# Patient Record
Sex: Male | Born: 1940 | Race: White | Hispanic: No | Marital: Married | Smoking: Former smoker
Health system: Southern US, Community
[De-identification: ages and names within clinical notes are randomized; demographics above are authoritative.]

## PROBLEM LIST (undated history)

## (undated) DIAGNOSIS — K635 Polyp of colon: Secondary | ICD-10-CM

## (undated) DIAGNOSIS — I639 Cerebral infarction, unspecified: Secondary | ICD-10-CM

## (undated) DIAGNOSIS — R972 Elevated prostate specific antigen [PSA]: Secondary | ICD-10-CM

## (undated) DIAGNOSIS — N200 Calculus of kidney: Secondary | ICD-10-CM

## (undated) DIAGNOSIS — I493 Ventricular premature depolarization: Secondary | ICD-10-CM

## (undated) DIAGNOSIS — M109 Gout, unspecified: Secondary | ICD-10-CM

## (undated) DIAGNOSIS — I10 Essential (primary) hypertension: Secondary | ICD-10-CM

## (undated) DIAGNOSIS — E119 Type 2 diabetes mellitus without complications: Secondary | ICD-10-CM

## (undated) DIAGNOSIS — E785 Hyperlipidemia, unspecified: Secondary | ICD-10-CM

## (undated) DIAGNOSIS — G8929 Other chronic pain: Secondary | ICD-10-CM

## (undated) DIAGNOSIS — M7730 Calcaneal spur, unspecified foot: Secondary | ICD-10-CM

## (undated) DIAGNOSIS — G4700 Insomnia, unspecified: Secondary | ICD-10-CM

## (undated) DIAGNOSIS — D649 Anemia, unspecified: Secondary | ICD-10-CM

## (undated) DIAGNOSIS — G629 Polyneuropathy, unspecified: Secondary | ICD-10-CM

## (undated) DIAGNOSIS — H409 Unspecified glaucoma: Secondary | ICD-10-CM

## (undated) HISTORY — DX: Calcaneal spur, unspecified foot: M77.30

## (undated) HISTORY — DX: Ventricular premature depolarization: I49.3

## (undated) HISTORY — PX: TONSILLECTOMY: SUR1361

## (undated) HISTORY — PX: CARDIOVASCULAR STRESS TEST: SHX262

## (undated) HISTORY — DX: Polyp of colon: K63.5

## (undated) HISTORY — DX: Cerebral infarction, unspecified: I63.9

## (undated) HISTORY — DX: Other chronic pain: G89.29

## (undated) HISTORY — DX: Essential (primary) hypertension: I10

## (undated) HISTORY — DX: Type 2 diabetes mellitus without complications: E11.9

## (undated) HISTORY — PX: ESOPHAGOGASTRODUODENOSCOPY: SHX1529

## (undated) HISTORY — DX: Anemia, unspecified: D64.9

## (undated) HISTORY — DX: Insomnia, unspecified: G47.00

## (undated) HISTORY — DX: Polyneuropathy, unspecified: G62.9

## (undated) HISTORY — DX: Elevated prostate specific antigen (PSA): R97.20

## (undated) HISTORY — PX: OTHER SURGICAL HISTORY: SHX169

## (undated) HISTORY — DX: Gout, unspecified: M10.9

## (undated) HISTORY — DX: Calculus of kidney: N20.0

## (undated) HISTORY — DX: Hyperlipidemia, unspecified: E78.5

## (undated) HISTORY — DX: Unspecified glaucoma: H40.9

---

## 1993-09-23 HISTORY — PX: PENECTOMY: SHX741

## 1998-09-23 HISTORY — PX: CYSTECTOMY: SUR359

## 2008-09-23 HISTORY — PX: HERNIA REPAIR: SHX51

## 2015-09-24 HISTORY — PX: COLONOSCOPY: SHX174

## 2019-07-20 ENCOUNTER — Other Ambulatory Visit: Payer: Self-pay

## 2019-07-20 ENCOUNTER — Ambulatory Visit: Payer: BC Managed Care – PPO | Admitting: Gastroenterology

## 2019-07-20 ENCOUNTER — Encounter: Payer: Self-pay | Admitting: Gastroenterology

## 2019-07-20 VITALS — BP 140/80 | HR 80 | Temp 98.4°F | Ht 70.0 in | Wt 192.0 lb

## 2019-07-20 DIAGNOSIS — K644 Residual hemorrhoidal skin tags: Secondary | ICD-10-CM

## 2019-07-20 DIAGNOSIS — K5900 Constipation, unspecified: Secondary | ICD-10-CM

## 2019-07-20 NOTE — Progress Notes (Signed)
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  He uncommonly has constipation, but last week had not moved his bowels for several days, then was straining heavily and had anal pain with swelling and protrusion.  He showed me a photograph of it, and it appears to be significantly swollen external hemorrhoids.  He started taking 3 stool softeners a day, and the swelling gradually subsided.  He was able to have a good bowel movement yesterday and again today.  He had a colonoscopy in 2017 living in [place], believes there were no polyps and was being kept up to date because of his mother's history of colon cancer in her late 57s.  Aaron Oliver will get the prior report to confirm those findings and to see whether a follow-up colonoscopy was recommended.  He also reports hemorrhoidal banding by a surgeon many years ago while living in [place].  That was performed because he was having rectal bleeding.  ROS:  Review of Systems He denies chest pain dyspnea or dysuria  Past Medical History: Past Medical History:  Diagnosis Date  . Chronic back pain   . Colon polyp   . Diabetes ([place])   . Elevated PSA   . Gout   . Heel spur   . Hyperlipemia   . Hypertension   . Insomnia   . Kidney stone   . Neuropathy   . PVC (premature ventricular contraction)      Past Surgical History: No prior surgery  Family History: Family History  Problem Relation Age of Onset  . Colon cancer Mother        in late 33's  . Diabetes Mother   . Hypertension Mother   . Stroke Father     Social History: Social History   Socioeconomic History   . Marital status: Married    Spouse name: irene  . Number of children: 1  . Years of education: Not on file  . Highest education level: Not on file  Occupational History  . Occupation: retired crim atty  Social Needs  . Financial resource strain: Not on file  . Food insecurity    Worry: Not on file    Inability: Not on file  . Transportation needs    Medical: Not on file    Non-medical: Not on file  Tobacco Use  . Smoking status: Former Research scientist (life sciences)  . Smokeless tobacco: Never Used  Substance and Sexual Activity  . Alcohol use: Yes    Comment: occ  . Drug use: Yes    Types: Marijuana    Comment: rare  . Sexual activity: Not on file  Lifestyle  . Physical activity    Days per week: Not on file    Minutes per session: Not on file  . Stress: Not on file  Relationships  . Social Herbalist on phone: Not on file    Gets together: Not on file    Attends religious service: Not on file    Active member of club or organization: Not on file    Attends meetings of clubs or organizations: Not on file    Relationship status: Not on file  Other Topics  Concern  . Not on file  Social History Narrative  . 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  Allergies: No Known Allergies  Outpatient Meds: Current Outpatient Medications  Medication Sig Dispense Refill  . allopurinol (ZYLOPRIM) 100 MG tablet Take 100 mg by mouth daily.    . Alpha-Lipoic Acid 600 MG CAPS Take 1 capsule by mouth once.    Marland Kitchen aspirin EC 81 MG tablet Take 81 mg by mouth daily.    . carvedilol (COREG) 12.5 MG tablet Take 12.5 mg by mouth 2 (two) times daily with a meal.    . Cholecalciferol (VITAMIN D3) 50 MCG (2000 UT) TABS Take by mouth once.    . colchicine 0.6 MG tablet Take 0.6 mg by mouth daily.    . Ferrous Sulfate (IRON) 325 (65 Fe) MG TABS Take by mouth once.    . gabapentin  (NEURONTIN) 600 MG tablet Take 600 mg by mouth 2 (two) times daily.    . metFORMIN (GLUCOPHAGE) 1000 MG tablet Take 1,000 mg by mouth 2 (two) times daily with a meal.    . Multiple Vitamins-Minerals (CENTRUM ADULTS) TABS Take 1 tablet by mouth once.    . rosuvastatin (CRESTOR) 20 MG tablet Take 20 mg by mouth daily.    . valsartan-hydrochlorothiazide (DIOVAN-HCT) 160-12.5 MG tablet Take 1 tablet by mouth daily.    Marland Kitchen zolpidem (AMBIEN) 10 MG tablet Take 10 mg by mouth at bedtime as needed for sleep.     No current facility-administered medications for this visit.       ___________________________________________________________________ Objective   Exam:  BP 140/80   Pulse 80   Temp 98.4 F (36.9 C)   Ht 5\' 10"  (1.778 m)   Wt 192 lb (87.1 kg)   BMI 27.55 kg/m    General: Well-appearing  Eyes: sclera anicteric, no redness  ENT: oral mucosa moist without lesions, no cervical or supraclavicular lymphadenopathy  CV: RRR without murmur, S1/S2, no JVD, no peripheral edema  Resp: clear to auscultation bilaterally, normal RR and effort noted  GI: soft, no tenderness, with active bowel sounds. No guarding or palpable organomegaly noted.  Skin; warm and dry, no rash or jaundice noted  Neuro: awake, alert and oriented x 3. Normal gross motor function and fluent speech Rectal:small,  Nonthrombosed external hemorrhoid tissue, DRE without tenderness, fissure or palpable internal lesion.  Prostate soft and enlarged, soft stool rectal vault.  Labs:  Last hemoglobin normal in May of this year with primary care.  Assessment: Encounter Diagnoses  Name Primary?  . External hemorrhoid Yes  . Acute constipation     Exacerbation of external hemorrhoid swelling during a period of constipation, now resolved.  He may have even had thrombosis of external hemorrhoids that triggered this. We discussed hemorrhoidal anatomy, diagram was shown.  Plan:  Sufficient water and dietary fiber  intake, stool softener every other day to avoid constipation and similar episodes in the future. Not clear if he should have another routine colonoscopy unless previous procedure reports can be obtained for review. See me as needed.  Thank you for the courtesy of this consult.  Please call me with any questions or concerns.  Nelida Meuse III  CC: Referring provider noted above

## 2019-07-20 NOTE — Patient Instructions (Addendum)
If you are age 78 or older, your body mass index should be between 23-30. Your Body mass index is 27.55 kg/m. If this is out of the aforementioned range listed, please consider follow up with your Primary Care Provider.  If you are age 56 or younger, your body mass index should be between 19-25. Your Body mass index is 27.55 kg/m. If this is out of the aformentioned range listed, please consider follow up with your Primary Care Provider.   Start Miralax 1- capful daily- dissolve 17gram in at least 8 ounces of water /juice daily.  Thank you for choosing me and [facility].  Dr.Henry Loletha Carrow

## 2021-02-12 ENCOUNTER — Other Ambulatory Visit: Payer: Self-pay | Admitting: Internal Medicine

## 2021-02-12 ENCOUNTER — Other Ambulatory Visit (HOSPITAL_COMMUNITY): Payer: Self-pay | Admitting: Internal Medicine

## 2021-02-12 DIAGNOSIS — R531 Weakness: Secondary | ICD-10-CM

## 2021-02-12 DIAGNOSIS — R42 Dizziness and giddiness: Secondary | ICD-10-CM

## 2021-02-13 ENCOUNTER — Other Ambulatory Visit: Payer: Self-pay

## 2021-02-13 ENCOUNTER — Other Ambulatory Visit (HOSPITAL_COMMUNITY): Payer: Self-pay | Admitting: Internal Medicine

## 2021-02-13 ENCOUNTER — Ambulatory Visit (HOSPITAL_COMMUNITY)
Admission: RE | Admit: 2021-02-13 | Discharge: 2021-02-13 | Disposition: A | Discharge status: Home / Self Care | Payer: Medicare Other | Source: Ambulatory Visit | Attending: Internal Medicine | Admitting: Internal Medicine

## 2021-02-13 ENCOUNTER — Other Ambulatory Visit: Payer: Self-pay | Admitting: Internal Medicine

## 2021-02-13 DIAGNOSIS — R42 Dizziness and giddiness: Secondary | ICD-10-CM | POA: Insufficient documentation

## 2021-02-13 DIAGNOSIS — I639 Cerebral infarction, unspecified: Secondary | ICD-10-CM

## 2021-02-13 DIAGNOSIS — R531 Weakness: Secondary | ICD-10-CM | POA: Diagnosis present

## 2021-02-13 IMAGING — MR MR MRA HEAD W/O CM
2 series · 19 of 48 positions shown · non-contrast
Comparison: MRI same day

CLINICAL DATA: Weakness, dizziness and giddiness. Small acute
bilateral cerebellar infarctions.

EXAM:
MRA HEAD WITHOUT CONTRAST
TECHNIQUE: Angiographic images of the Circle of Willis were acquired using MRA
technique without intravenous contrast.

[Series 3: ax (id) · axial · 1.0mm · 0.43mm/px · z∈[-85,-3]mm · 18 of 176 slices shown]
[im 1/176]
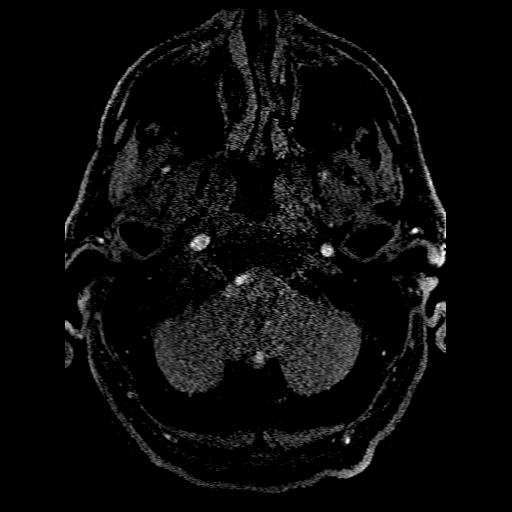
[im 4/176]
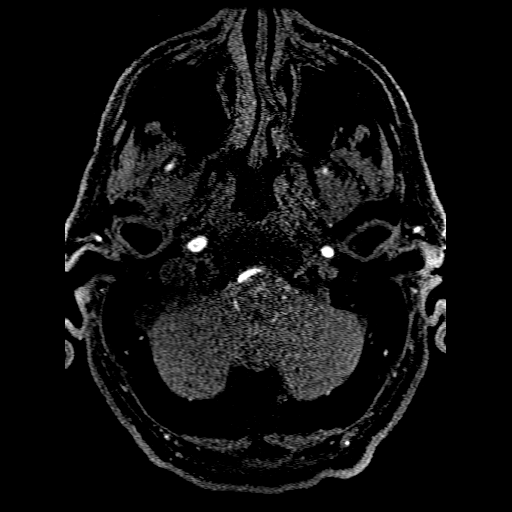
[im 8/176]
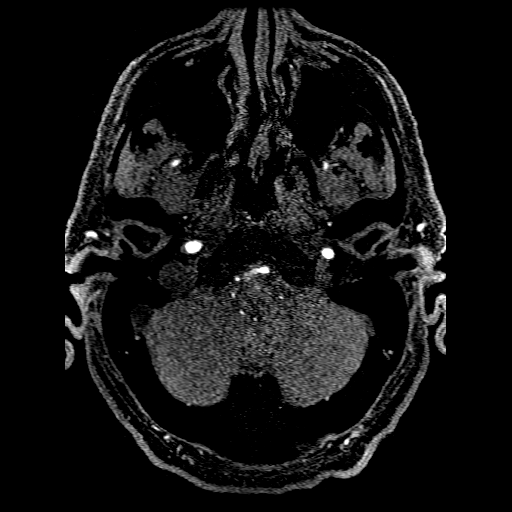
[im 12/176]
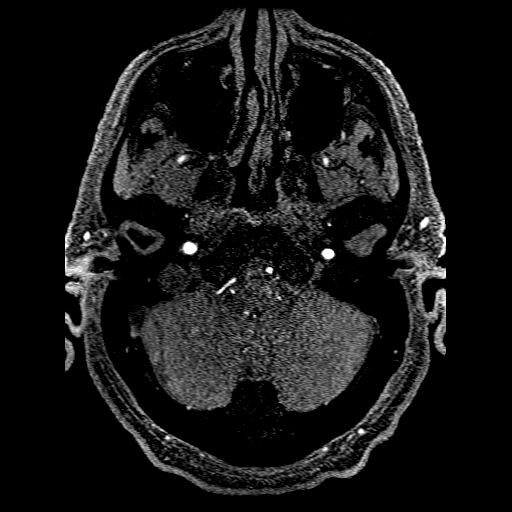
[im 16/176]
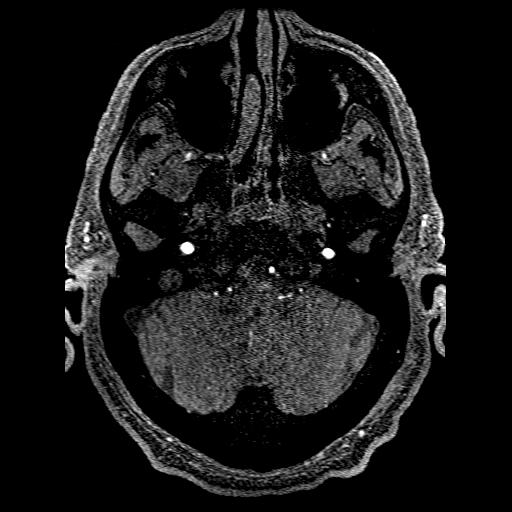
[im 20/176]
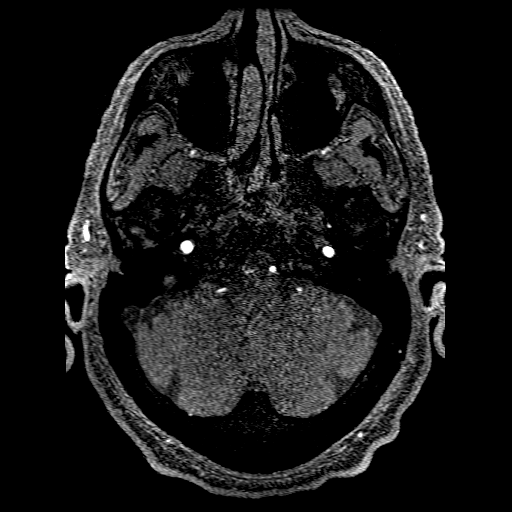
[im 23/176]
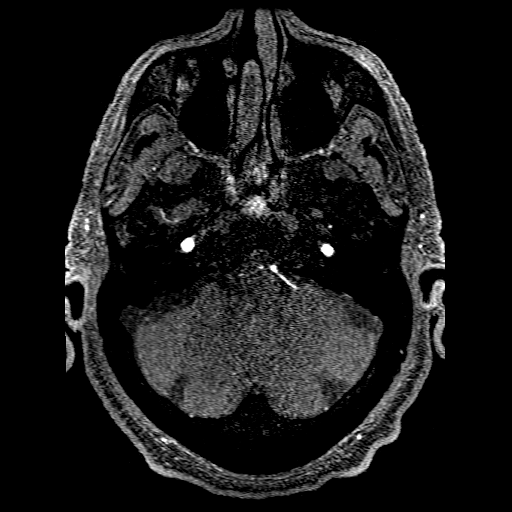
[im 27/176]
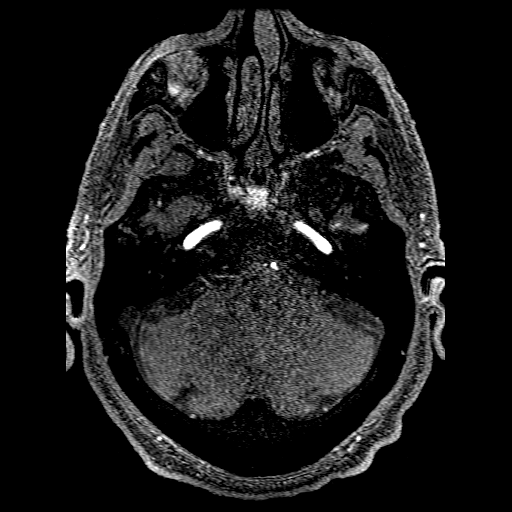
[im 31/176]
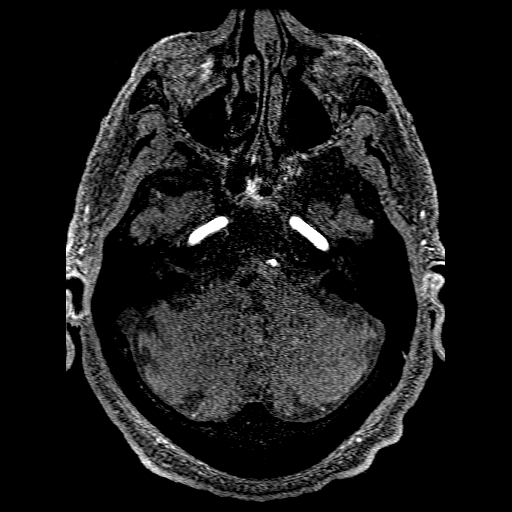
[im 35/176]
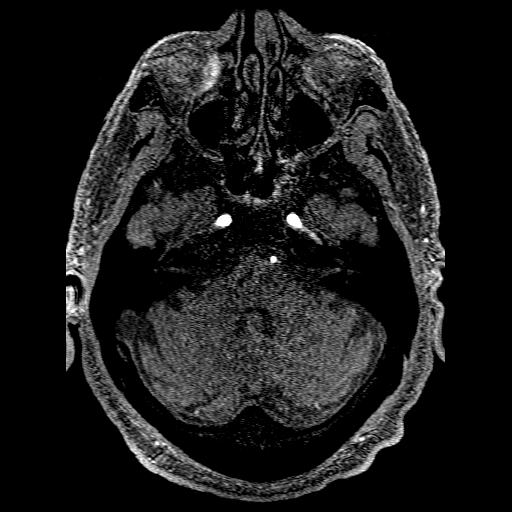
[im 54/176]
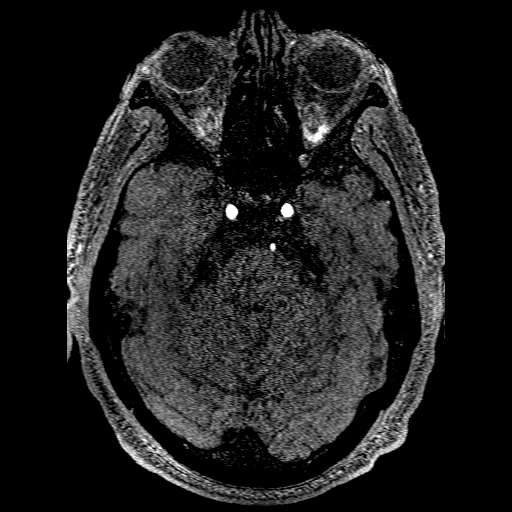
[im 77/176]
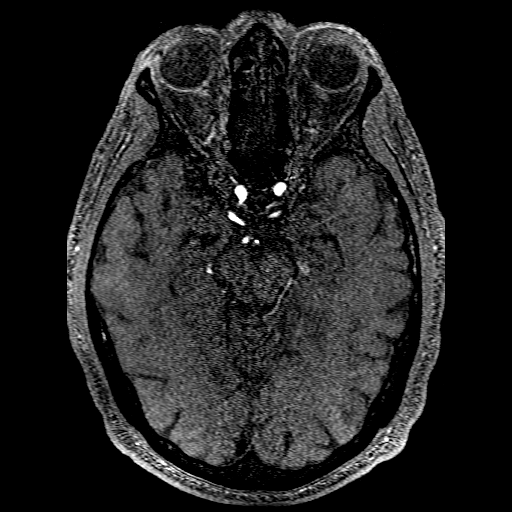
[im 88/176]
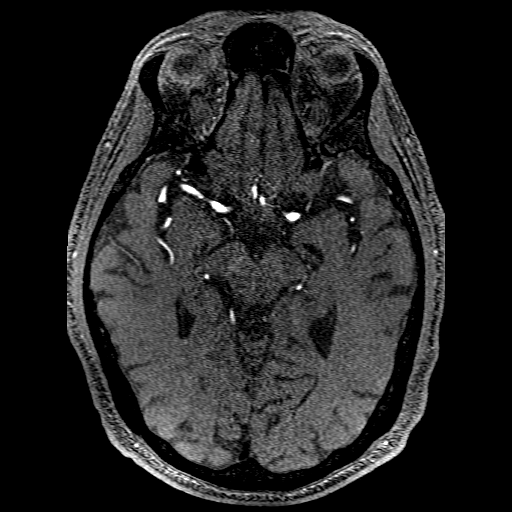
[im 99/176]
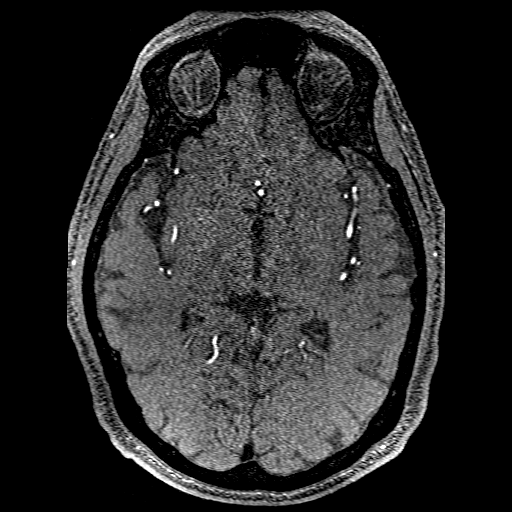
[im 122/176]
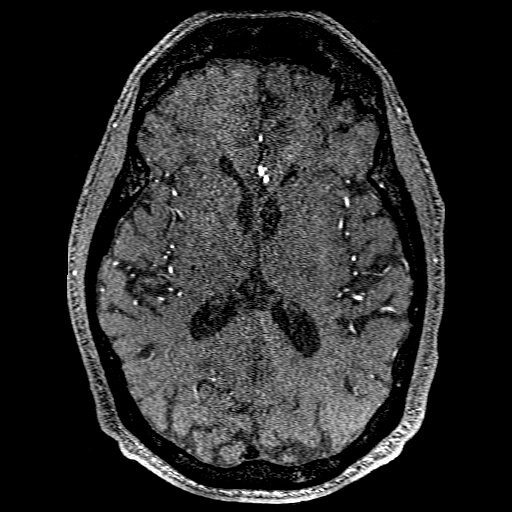
[im 145/176]
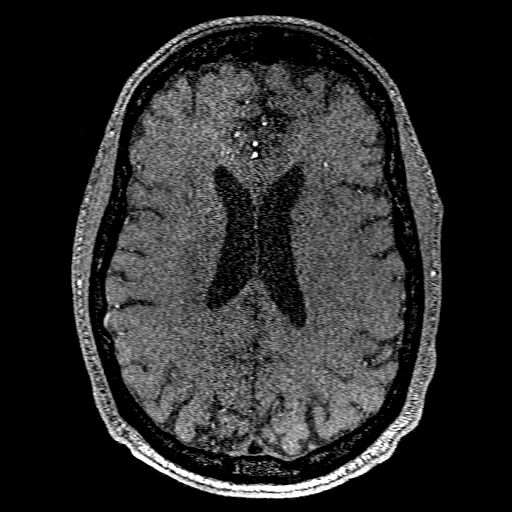
[im 149/176]
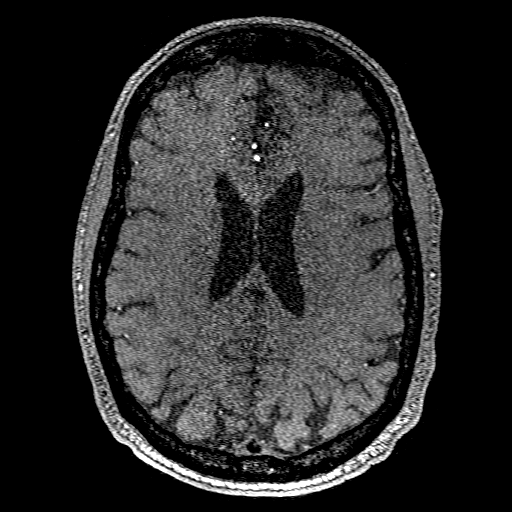
[im 168/176]
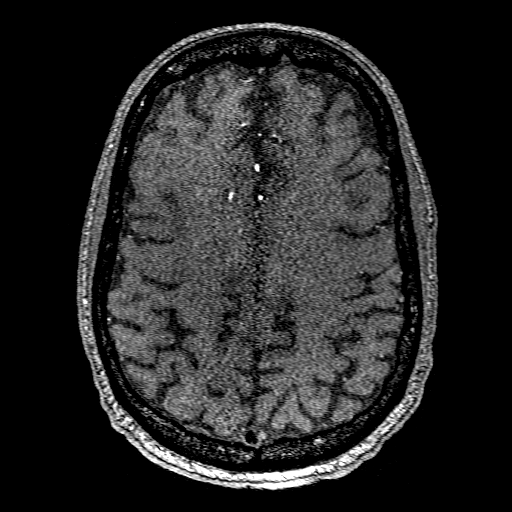

[Series 301: pjn:ax (id) · sagittal · 1.0mm · 0.43mm/px · 1 of 3 slices shown]
[im 1/3]
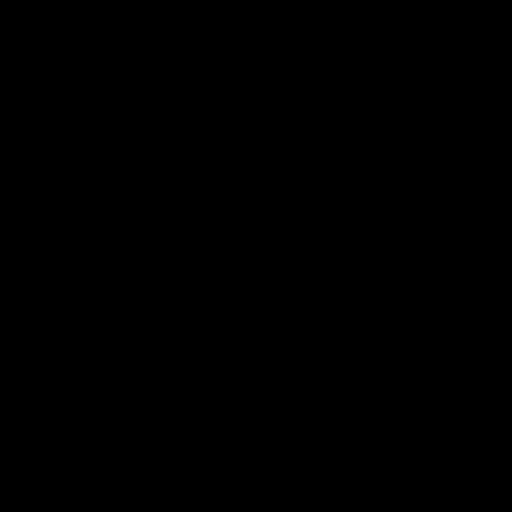

[19 of 48 positions shown; findings below may reference images not displayed]

FINDINGS: Anterior circulation: Both internal carotid arteries widely patent
through the skull base and siphon regions. The anterior and middle
cerebral vessels are widely patent and normal in appearance. Both
posterior cerebral arteries take fetal origin from the anterior
circulation and appear normal. The left PCA does receive some supply
from the basilar tip.

Posterior circulation: Image volume begins above the foramen magnum.
Flow is present in the dominant right vertebral artery. Right PICA
shows flow. No antegrade flow seen in a left vertebral artery. The
basilar artery is small but patent. Both anterior inferior
cerebellar arteries show flow, larger on the left than the right.
Basilar tip shows supply to both superior cerebellar arteries.
Minimal contribution to the left PCA as noted above.

Anatomic variants: None other significant.

Other: None.
IMPRESSION: The imaging volume is high, beginning above the foramen magnum.
Dominant right vertebral artery gives off PICA and then supplies the
basilar. No antegrade flow seen in the left vertebral artery. Flow
is present in both anterior inferior cerebellar arteries and both
superior cerebellar arteries. Both posterior cerebral arteries take
fetal origin from the anterior circulation, though there is some
contribution from the basilar tip to the left PCA.

## 2021-02-13 IMAGING — MR MR HEAD W/O CM
10 series · 48 of 48 positions shown · non-contrast
Comparison: None.

CLINICAL DATA: Weakness, dizziness and giddiness

EXAM:
MRI HEAD WITHOUT CONTRAST
TECHNIQUE: Multiplanar, multiecho pulse sequences of the brain and surrounding
structures were obtained without intravenous contrast.

[Series 5: DWI · axial · 3.0mm · 1.36mm/px · z∈[-85,+67]mm · 9 of 104 slices shown (1 of 2)]
[im 1/104]
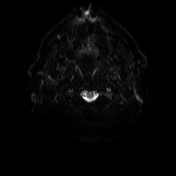
[im 13/104]
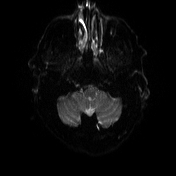
[im 26/104]
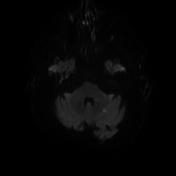
[im 39/104]
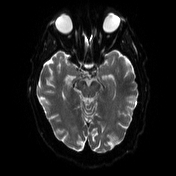
[im 52/104]
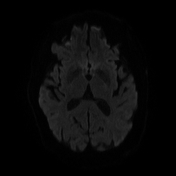
[im 65/104]
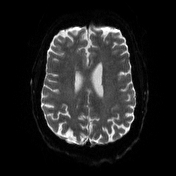
[im 78/104]
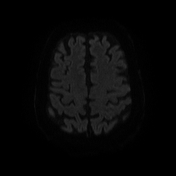
[im 91/104]
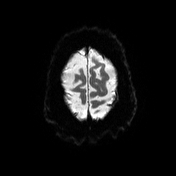
[im 104/104]
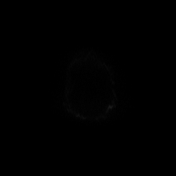

[Series 6: DWI · axial · 3.0mm · 1.36mm/px · z∈[-85,+67]mm · 4 of 52 slices shown (2 of 2)]
[im 1/52]
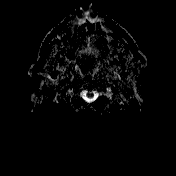
[im 18/52]
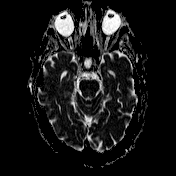
[im 35/52]
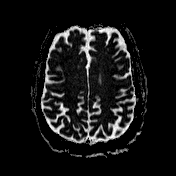
[im 52/52]
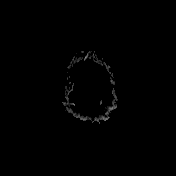

[Series 7: T1 · sagittal · 5.0mm · 0.75mm/px · 2 of 24 slices shown (1 of 2)]
[im 1/24]
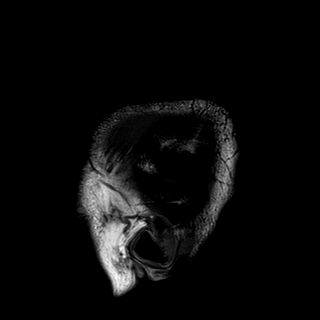
[im 24/24]
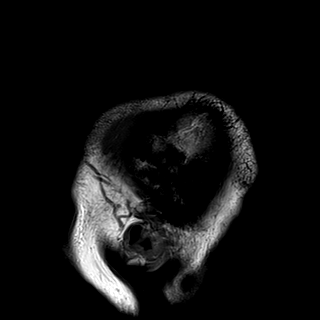

[Series 8: T2 · axial · 5.0mm · 0.62mm/px · z∈[-90,+72]mm · 2 of 26 slices shown (1 of 2)]
[im 1/26]
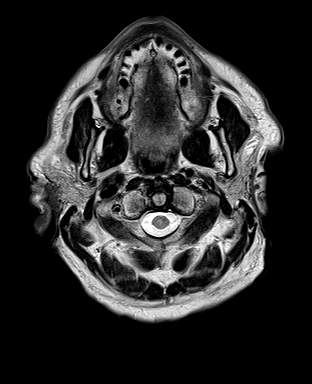
[im 26/26]
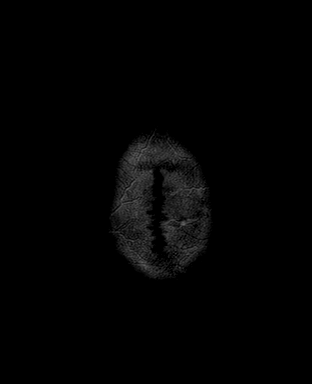

[Series 10: swi_images · axial · 3.0mm · 0.75mm/px · z∈[-115,+97]mm · 6 of 72 slices shown]
[im 1/72]
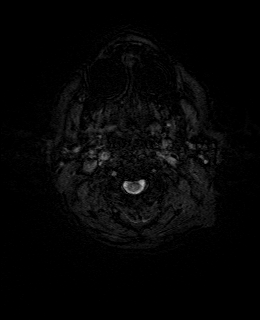
[im 15/72]
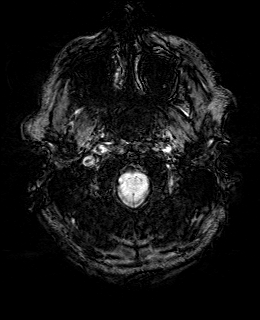
[im 29/72]
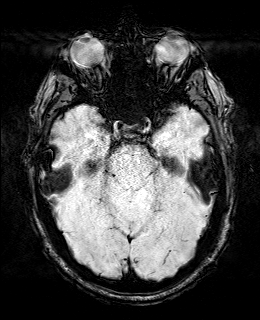
[im 43/72]
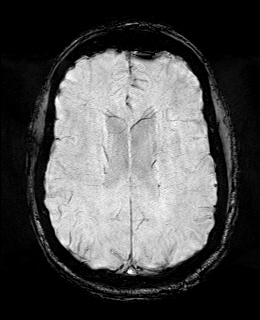
[im 57/72]
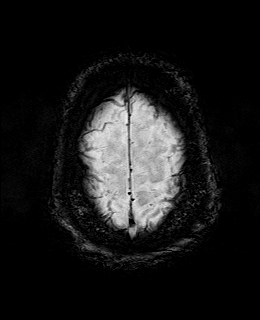
[im 72/72]
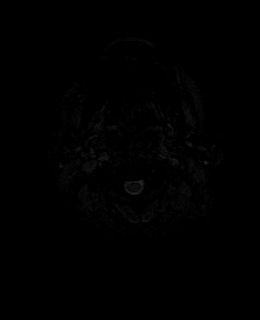

[Series 11: FLAIR · axial · 3.0mm · 0.75mm/px · z∈[-85,+67]mm · 4 of 52 slices shown]
[im 1/52]
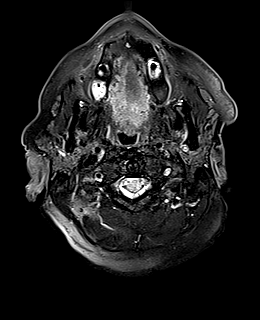
[im 18/52]
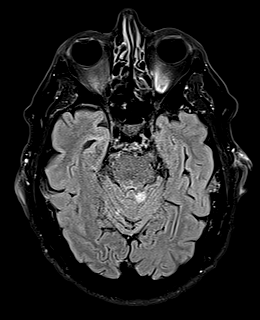
[im 35/52]
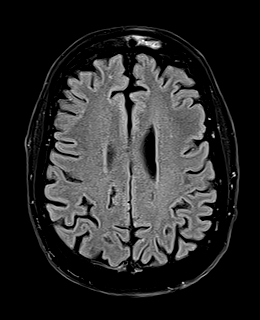
[im 52/52]
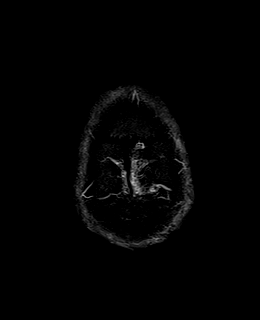

[Series 12: T1 · axial · 1.0mm · 0.94mm/px · z∈[-78,+79]mm · 12 of 160 slices shown (2 of 2)]
[im 1/160]
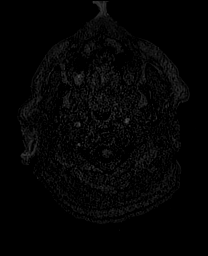
[im 15/160]
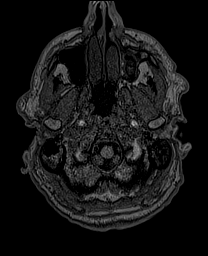
[im 29/160]
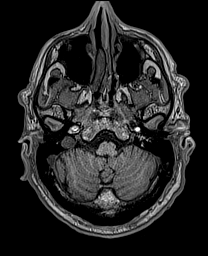
[im 44/160]
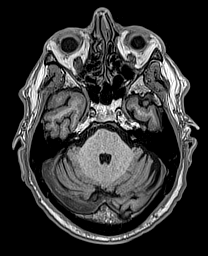
[im 58/160]
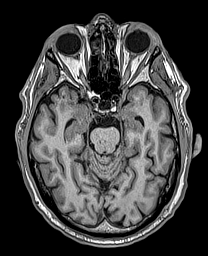
[im 73/160]
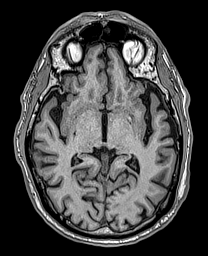
[im 87/160]
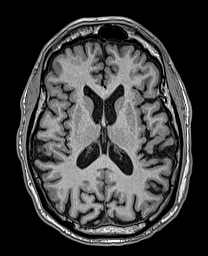
[im 102/160]
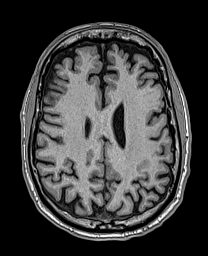
[im 116/160]
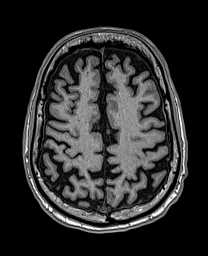
[im 131/160]
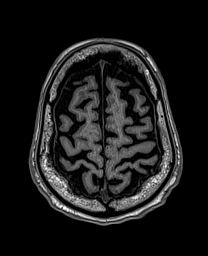
[im 145/160]
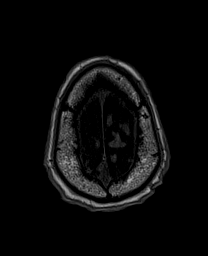
[im 160/160]
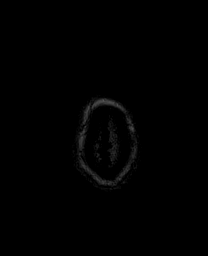

[Series 13: cor dwi_tracew · coronal · 5.0mm · 1.53mm/px · 5 of 64 slices shown]
[im 1/64]
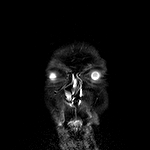
[im 16/64]
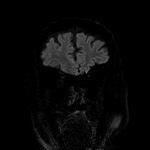
[im 32/64]
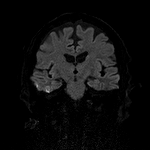
[im 48/64]
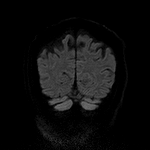
[im 64/64]
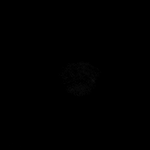

[Series 14: cor dwi_adc · coronal · 5.0mm · 1.53mm/px · 2 of 32 slices shown]
[im 1/32]
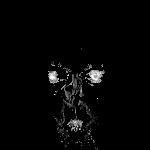
[im 32/32]
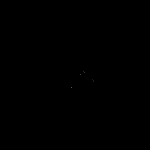

[Series 15: T2 · coronal · 5.0mm · 0.57mm/px · 2 of 32 slices shown (2 of 2)]
[im 1/32]
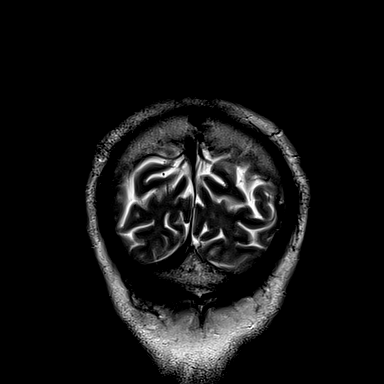
[im 32/32]
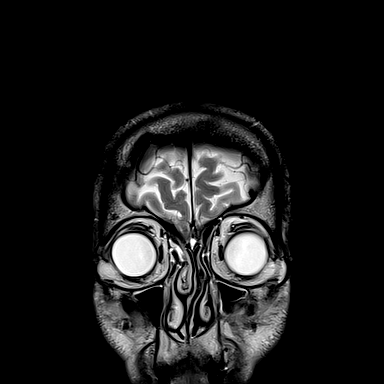

[48 of 48 positions shown; findings below may reference images not displayed]

FINDINGS: Brain: Few small foci of mildly reduced diffusion within the
bilateral cerebellum primarily involving the vermis. There is no
acute infarction or intracranial hemorrhage. There is no
intracranial mass, mass effect, or edema. There is no hydrocephalus
or extra-axial fluid collection. Prominence of the ventricles and
sulci reflects mild generalized parenchymal volume loss. Minimal
punctate foci of T2 hyperintensity in supratentorial white matter
may reflect minor chronic microvascular ischemic changes. Very small
chronic left cerebellar infarct.

Vascular: Diminished left vertebral artery flow void. Major vessel
flow voids at the skull base are otherwise preserved.

Skull and upper cervical spine: Normal marrow signal is preserved.

Sinuses/Orbits: Paranasal sinuses are aerated. Orbits are
unremarkable.

Other: Sella is unremarkable.  Mastoid air cells are clear.
IMPRESSION: Few small acute to subacute bilateral cerebellar infarcts.

Diminished flow void of the left vertebral artery is probably due to
congenitally diminutive size. Vascular imaging can be obtained as
indicated.

Small chronic left cerebellar infarct. Minor chronic microvascular
ischemic changes.

These results will be called to the ordering clinician or
representative by the Radiologist Assistant, and communication
documented in the PACS or [REDACTED].

## 2021-02-13 MED ORDER — GADOBUTROL 1 MMOL/ML IV SOLN
8.5000 mL | Freq: Once | INTRAVENOUS | Status: AC | PRN
  Administered 2021-02-13: 8.5 mL via INTRAVENOUS

## 2021-02-14 ENCOUNTER — Ambulatory Visit: Payer: Medicare Other | Admitting: Interventional Cardiology

## 2021-02-14 ENCOUNTER — Encounter: Payer: Self-pay | Admitting: *Deleted

## 2021-02-14 ENCOUNTER — Encounter: Payer: Self-pay | Admitting: Interventional Cardiology

## 2021-02-14 ENCOUNTER — Ambulatory Visit: Payer: Medicare Other

## 2021-02-14 VITALS — BP 142/68 | HR 69 | Ht 70.0 in | Wt 183.2 lb

## 2021-02-14 DIAGNOSIS — E1159 Type 2 diabetes mellitus with other circulatory complications: Secondary | ICD-10-CM

## 2021-02-14 DIAGNOSIS — I639 Cerebral infarction, unspecified: Secondary | ICD-10-CM

## 2021-02-14 DIAGNOSIS — I10 Essential (primary) hypertension: Secondary | ICD-10-CM

## 2021-02-14 NOTE — Progress Notes (Signed)
Patient ID: Aaron Oliver, male   DOB: 1941-05-26, 80 y.o.   MRN: [id number] ZIO AT not covered by [facility]. Patient enrolled for [employer] to ship a 14 day cardiac event monitor to his home. Letter with instructions mailed to patient.

## 2021-02-14 NOTE — Progress Notes (Signed)
Cardiology Office Note   Date:  02/14/2021   ID:  [name], DOB 1941/07/10, MRN [id number]  PCP:  Velna Hatchet, MD    No chief complaint on file.  CVA noted on MRI  Wt Readings from Last 3 Encounters:  02/14/21 183 lb 3.2 oz (83.1 kg)  07/20/19 192 lb (87.1 kg)       History of Present Illness: Aaron Oliver is a 80 y.o. male who is being seen today for the evaluation of stroke at the request of Velna Hatchet, MD.  Concern for atrial fibrillation.   He is from [place] originally.  Lived in [place] or 40 years.  Moved to [place] in 2016.    A few days ago, he woke up with some balance issues and leg weakness.  He was under stress due to his wife's illness; she has cancer.    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  Diminished flow void of the left vertebral artery is probably due to congenitally diminutive size. Vascular imaging can be obtained as indicated.  Small chronic left cerebellar infarct. Minor chronic microvascular ischemic changes." MRA: "Aortic arch appears normal. Branching pattern of the brachiocephalic vessels is normal without origin stenosis.  Both common carotid arteries are widely patent to the bifurcation. Both carotid bifurcations appear normal. Both cervical internal carotid arteries are normal.  Dominant right vertebral artery shows 30% stenosis at its origin but is widely patent beyond that through the cervical region, through the foramen magnum to the basilar. No flow is seen in the proximal right vertebral artery. There appears to be a reconstituted small right vertebral artery beginning at about the C7 level, which shows definite flow going as high as C1. On some of the source images, there appears to be a small amount of flow beyond the foramen magnum to the basilar. I cannot tell if this is congenital or acquired Pathology."  No prior cardiac history.  Routine stress test in 2016 which  was normal in [place].  Was going to the gym prior to [place].  Now exercises at home 5 days/week.    When he does yard work, he feels a little dizziness. Mild DOE.   Quit smoking about 35 years ago.  Add a DVT.    Mother had an MI was she was 55.  Dad had a stroke at 51. No siblings.   Denies : Chest pain.  Leg edema. Nitroglycerin use. Orthopnea. Palpitations. Paroxysmal nocturnal dyspnea. Syncope.   Mild DOE as noted above.    Past Medical History:  Diagnosis Date  . Chronic back pain   . Colon polyp   . Diabetes ([place])   . Elevated PSA   . Gout   . Heel spur   . Hyperlipemia   . Hypertension   . Insomnia   . Kidney stone   . Neuropathy   . PVC (premature ventricular contraction)     Past Surgical History:  Procedure Laterality Date  . CARDIOVASCULAR STRESS TEST    . COLONOSCOPY  09/2015  . 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 ESOPHAGOGASTRODUODENOSCOPY    . HERNIA REPAIR  5427   umbilicial  . holter mon    . PENECTOMY  1995  . TONSILLECTOMY       Current Outpatient Medications  Medication Sig Dispense Refill  . allopurinol (ZYLOPRIM) 100 MG tablet Take 100 mg by mouth daily.    . Alpha-Lipoic Acid  600 MG CAPS Take 1 capsule by mouth once.    Marland Kitchen aspirin EC 81 MG tablet Take 81 mg by mouth daily.    . carvedilol (COREG) 12.5 MG tablet Take 12.5 mg by mouth 2 (two) times daily with a meal.    . Cholecalciferol (VITAMIN D3) 50 MCG (2000 UT) TABS Take by mouth once.    . colchicine 0.6 MG tablet Take 0.6 mg by mouth daily.    . Ferrous Sulfate (IRON) 325 (65 Fe) MG TABS Take by mouth once.    . metFORMIN (GLUCOPHAGE) 1000 MG tablet Take 1,000 mg by mouth 2 (two) times daily with a meal.    . Multiple Vitamins-Minerals (CENTRUM ADULTS) TABS Take 1 tablet by mouth once.    . naproxen sodium (ALEVE) 220 MG tablet as needed.    . rosuvastatin (CRESTOR) 20 MG tablet Take 20 mg by mouth daily.    . valsartan-hydrochlorothiazide (DIOVAN-HCT) 160-12.5 MG  tablet Take 1 tablet by mouth daily.    Marland Kitchen zolpidem (AMBIEN) 10 MG tablet Take 10 mg by mouth at bedtime as needed for sleep.    . clopidogrel (PLAVIX) 75 MG tablet Take 1 tablet by mouth daily.    Marland Kitchen gabapentin (NEURONTIN) 100 MG capsule Take 100 mg by mouth 2 (two) times daily.    Marland Kitchen gabapentin (NEURONTIN) 400 MG capsule Take 400 mg by mouth 2 (two) times daily.    Marland Kitchen LORazepam (ATIVAN) 1 MG tablet Take 1 mg by mouth at bedtime as needed.    . sildenafil (VIAGRA) 100 MG tablet Take 100 mg by mouth as needed.     No current facility-administered medications for this visit.    Allergies:   Patient has no known allergies.    Social History:  The patient  reports that he has quit smoking. He has never used smokeless tobacco. He reports current alcohol use. He reports current drug use. Drug: Marijuana.   Family History:  The patient's family history includes Colon cancer in his mother; Diabetes in his mother; Hypertension in his mother; Stroke in his father.    ROS:  Please see the history of present illness.   Otherwise, review of systems are positive for balance issues.   All other systems are reviewed and negative.    PHYSICAL EXAM: VS:  BP (!) 142/68   Pulse 69   Ht 5\' 10"  (1.778 m)   Wt 183 lb 3.2 oz (83.1 kg)   SpO2 98%   BMI 26.29 kg/m  , BMI Body mass index is 26.29 kg/m. GEN: Well nourished, well developed, in no acute distress  HEENT: normal  Neck: no JVD, carotid bruits, or masses Cardiac: RRR; no murmurs, rubs, or gallops,no edema  Respiratory:  clear to auscultation bilaterally, normal work of breathing GI: soft, nontender, nondistended, + BS MS: no deformity or atrophy  Skin: warm and dry, no rash Neuro:  Strength and sensation are intact Psych: euthymic mood, full affect   EKG:   The ekg ordered today demonstrates normal sinus rhythm, no ST segment changes   Recent Labs: No results found for requested labs within last 8760 hours.   Lipid Panel No results  found for: CHOL, TRIG, HDL, CHOLHDL, VLDL, LDLCALC, LDLDIRECT   Other studies Reviewed: Additional studies/ records that were reviewed today with results demonstrating: LDL 29.  A1c 5.7 in August 2021.   ASSESSMENT AND PLAN:  1. CVA: COncern for AFib as potential cause of cerebral infarcts.  Check monitor, 2 weeks Zio patch,  to screen for atrial fibrillation.  We also discussed ILR if monitor was negative.  We will also check echocardiogram to look for any structural heart disease. 2. Diabetes: Well-controlled.  Continue current medication.  He needs to try to follow a healthy diet.  High-fiber, plant-based diet will be helpful. 3. Hypertension: The current medical regimen is effective;  continue present plan and medications.   Current medicines are reviewed at length with the patient today.  The patient concerns regarding his medicines were addressed.  The following changes have been made:  No change  Labs/ tests ordered today include:  No orders of the defined types were placed in this encounter.   Recommend 150 minutes/week of aerobic exercise Low fat, low carb, high fiber diet recommended  Disposition:   FU in 1 year, or sooner if there are issues on the cardiac testing   Signed, Larae Grooms, MD  02/14/2021 10:19 AM    [facility] [address], [place], [place]  [address] Phone: [phone]; Fax: [phone]

## 2021-02-14 NOTE — Patient Instructions (Signed)
Medication Instructions:  Your physician recommends that you continue on your current medications as directed. Please refer to the Current Medication list given to you today.  *If you need a refill on your cardiac medications before your next appointment, please call your pharmacy*   Lab Work: none If you have labs (blood work) drawn today and your tests are completely normal, you will receive your results only by: Marland Kitchen MyChart Message (if you have MyChart) OR . A paper copy in the mail If you have any lab test that is abnormal or we need to change your treatment, we will call you to review the results.   Testing/Procedures: Your physician has requested that you have an echocardiogram. Echocardiography is a painless test that uses sound waves to create images of your heart. It provides your doctor with information about the size and shape of your heart and how well your heart's chambers and valves are working. This procedure takes approximately one hour. There are no restrictions for this procedure.  Your physician has recommended that you wear an event monitor. Event monitors are medical devices that record the heart's electrical activity. Doctors most often [place] these monitors to diagnose arrhythmias. Arrhythmias are problems with the speed or rhythm of the heartbeat. The monitor is a small, portable device. You can wear one while you do your normal daily activities. This is usually used to diagnose what is causing palpitations/syncope (passing out).  14 day zio   Follow-Up: At [facility], you and your health needs are our priority.  As part of our continuing mission to provide you with exceptional heart care, we have created designated Provider Care Teams.  These Care Teams include your primary Cardiologist (physician) and Advanced Practice Providers (APPs -  Physician Assistants and Nurse Practitioners) who all work together to provide you with the care you need, when you need it.  We  recommend signing up for the patient portal called "MyChart".  Sign up information is provided on this After Visit Summary.  MyChart is used to connect with patients for Virtual Visits (Telemedicine).  Patients are able to view lab/test results, encounter notes, upcoming appointments, etc.  Non-urgent messages can be sent to your provider as well.   To learn more about what you can do with MyChart, go to [email or web address].    Your next appointment:   12 month(s)  The format for your next appointment:   In Person  Provider:   You may see Larae Grooms, MD or one of the following Advanced Practice Providers on your designated Care Team:    Melina Copa, PA-C  Ermalinda Barrios, PA-C    Other Instructions  ZIO AT Long term monitor-Live Telemetry  Your physician has requested you wear a ZIO patch monitor for 14__ days.  This is a single patch monitor. [employer] supplies one patch monitor per enrollment. Additional stickers are not available.  Please do not apply patch if you will be having a Nuclear Stress Test, Echocardiogram, Cardiac CT, MRI, or Chest Xray during the time frame you would be wearing the monitor. The patch cannot be worn during these tests. You cannot remove and re-apply the ZIO AT patch monitor.   Your ZIO patch monitor will be sent Fed Ex from [employer] directly to your home address. The monitor may also be mailed to a PO BOX if home delivery is not available. It may take 3-5 days to receive your monitor after you have been enrolled.  Once you have received you monitor,  please review enclosed instructions. Your monitor has already been registered assigning a specific monitor serial # to you.   Applying the monitor  Shave hair from upper left chest.  Hold abrader disc by orange tab. Rub abrader in 40 strokes over left upper chest as indicated in your monitor instructions.  Clean area with 4 enclosed alcohol pads. Use all pads to ensure the area is cleaned  thoroughly. Let dry.  Apply patch as indicated in monitor instructions. Patch will be placed under collarbone on left side of chest with arrow pointing upward.  Rub patch adhesive wings for 2 minutes. Remove the white label marked "1". Remove the white label marked "2". Rub patch adhesive wings for 2 additional minutes.  While looking in a mirror, press and release button in center of patch. A small green light will flash 3-4 times. This will be your only indicator the monitor has been turned on.  Do not shower for the first 24 hours. You may shower after the first 24 hours.  Press the button if you feel a symptom. You will hear a small click. Record Date, Time and Symptom in the Patient Log.   Starting the Gateway  In your kit there is a Hydrographic surveyor box the size of a cellphone. This is Airline pilot. It transmits all your recorded data to [facility]. This box must stay within 10 feet of you at all times. Open the box and push the * button. There will be a light that blinks orange and then green a few times. When the light stops blinking, the Gateway is connected to the ZIO patch.  Call [employer] at [phone] to confirm your monitor is transmitting.   Returning your monitor  Remove your patch and place it inside the [place]. In the lower half of the Gateway there is a white bag with prepaid postage on it. Place Gateway in bag and seal. Mail package back to [place] as soon as possible. Your physician should have your final report approximately 7 days after you have mailed back your monitor.   Call [place] at [phone] if you have questions regarding your ZIO AT patch monitor. Call them immediately if you see an orange light blinking on your monitor.  If your monitor falls off in less than 4 days contact our Monitor department at [phone]. If your monitor becomes loose or falls off after 4 days call [employer] at [phone] for suggestions on securing your monitor.

## 2021-02-14 NOTE — Addendum Note (Signed)
Addended by: Thompson Grayer on: 02/14/2021 12:01 PM   Modules accepted: Orders

## 2021-02-20 ENCOUNTER — Other Ambulatory Visit: Payer: Self-pay

## 2021-02-20 ENCOUNTER — Encounter: Payer: Self-pay | Admitting: Neurology

## 2021-02-20 ENCOUNTER — Ambulatory Visit: Payer: Medicare Other | Admitting: Neurology

## 2021-02-20 VITALS — BP 129/65 | HR 76 | Ht 70.0 in | Wt 185.0 lb

## 2021-02-20 DIAGNOSIS — I639 Cerebral infarction, unspecified: Secondary | ICD-10-CM | POA: Diagnosis not present

## 2021-02-20 DIAGNOSIS — R519 Headache, unspecified: Secondary | ICD-10-CM

## 2021-02-20 NOTE — Progress Notes (Signed)
Chief Complaint  Patient presents with  . New Patient (Initial Visit)    He is here with his wife, Aaron Oliver, to follow up after CVA. He is currently taking Plavix 75mg  QD. Since the event, he at times feels off balance. He is having mild headaches daily.      ASSESSMENT AND PLAN  Aaron Oliver is a 80 y.o. male  Acute stroke  Involving bilateral cerebellum, posterior circulation  Thrombosis versus embolic  Is already under cardiologist care, planning on cardiac monitoring, echocardiogram  He was on aspirin, was switched to 75 mg Plavix  Vascular risk factor of aging, hypertension, hyperlipidemia, diabetes,  Emphasized importance of moderate exercise, increase water intake   DIAGNOSTIC DATA (LABS, IMAGING, TESTING) - I reviewed patient records, labs, notes, testing and imaging myself where available.  MRA of brain and neck on Feb 13, 2021:  No abnormal carotid finding.  30% narrowing at the origin of the dominant right vertebral artery. The vessel is widely patent beyond that through the cervical region to the basilar.  No flow is identifiable in the proximal tiny left vertebral artery. This could be occluded and reconstituted by cervical collaterals in the lower cervical region. The vessel then does show flow to the foramen magnum, and it appears that there is the most minimal flow through this vessel to the basilar. It is difficult to know how much of these findings are congenital versus acquired. It is certainly possible that acquired disease of the left vertebral artery could result in the embolic infarctions of the superior cerebellum.  MRI of brain on Feb 13 2021 Few small acute to subacute bilateral cerebellar infarcts.  Diminished flow void of the left vertebral artery is probably due to congenitally diminutive size. Vascular imaging can be obtained as indicated.  Small chronic left cerebellar infarct. Minor chronic microvascular ischemic changes.  These  results will be called to the ordering clinician or representative by the Radiologist Assistant, and communication documented in the PACS or [employer].   HISTORICAL  Aaron Oliver is a 81 year old male, seen in request by his primary care physician Aaron Oliver, for evaluation of stroke, he is accompanied by his wife at today's visit Feb 20, 2021  I reviewed and summarized the referring note. PMHX. Gout HTN DM HLD  On Feb 10, 2021, he woke up from overnight sleep, noticed mild dizziness, unsteady, he thought it was due to his back issues, but he continue to notice intermittent unsteadiness sensation in the late a few days, lasting about 2 to 3 days, when he was seen by his primary care physician Aaron Oliver, referred him for MRI I personally reviewed MRI of the brain on Feb 13, 2021, Few small acute to subacute bilateral cerebellar infarction Angiogram of head and neck showed no significant carotid artery disease, diminished left vertebral artery, patent basilar artery  He was on aspirin, 81 mg daily for many years, since MRI findings, will switch to Plavix 75 mg  PHYSICAL EXAM:   Vitals:   02/20/21 1359  BP: 129/65  Pulse: 76  Weight: 185 lb (83.9 kg)  Height: 5\' 10"  (1.778 m)   Not recorded     Body mass index is 26.54 kg/m.  PHYSICAL EXAMNIATION:  Gen: NAD, conversant, well nourised, well groomed                     Cardiovascular: Regular rate rhythm, no peripheral edema, warm, nontender. Eyes: Conjunctivae clear without exudates or hemorrhage Neck: Supple,  no carotid bruits. Pulmonary: Clear to auscultation bilaterally   NEUROLOGICAL EXAM:  MENTAL STATUS: Speech:    Speech is normal; fluent and spontaneous with normal comprehension.  Cognition:     Orientation to time, place and person     Normal recent and remote memory     Normal Attention span and concentration     Normal Language, naming, repeating,spontaneous speech     Fund of  knowledge   CRANIAL NERVES: CN II: Visual fields are full to confrontation. Pupils are round equal and briskly reactive to light. CN III, IV, VI: extraocular movement are normal. No ptosis. CN V: Facial sensation is intact to light touch CN VII: Face is symmetric with normal eye closure  CN VIII: Hearing is normal to causal conversation. CN IX, X: Phonation is normal. CN XI: Head turning and shoulder shrug are intact  MOTOR: There is no pronator drift of out-stretched arms. Muscle bulk and tone are normal. Muscle strength is normal.  REFLEXES: Reflexes are 2+ and symmetric at the biceps, triceps, knees, and ankles. Plantar responses are flexor.  SENSORY: Intact to light touch, pinprick and vibratory sensation are intact in fingers and toes.  COORDINATION: There is no trunk or limb dysmetria noted.  GAIT/STANCE: Posture is normal. Gait is steady with normal steps, base, arm swing, and turning. Heel and toe walking are normal. Tandem gait is normal.  Romberg is absent.  REVIEW OF SYSTEMS:  Full 14 system review of systems performed and notable only for as above All other review of systems were negative.   ALLERGIES: No Known Allergies  HOME MEDICATIONS: Current Outpatient Medications  Medication Sig Dispense Refill  . allopurinol (ZYLOPRIM) 100 MG tablet Take 100 mg by mouth daily.    . Alpha-Lipoic Acid 600 MG CAPS Take 1 capsule by mouth once.    . carvedilol (COREG) 12.5 MG tablet Take 12.5 mg by mouth 2 (two) times daily with a meal.    . Cholecalciferol (VITAMIN D3) 50 MCG (2000 UT) TABS Take by mouth once.    . clopidogrel (PLAVIX) 75 MG tablet Take 1 tablet by mouth daily.    . colchicine 0.6 MG tablet Take 0.6 mg by mouth daily.    . Ferrous Sulfate (IRON) 325 (65 Fe) MG TABS Take by mouth once.    . gabapentin (NEURONTIN) 100 MG capsule Take 100 mg by mouth 2 (two) times daily.    Marland Kitchen gabapentin (NEURONTIN) 400 MG capsule Take 400 mg by mouth 2 (two) times daily.     Marland Kitchen LORazepam (ATIVAN) 1 MG tablet Take 1 mg by mouth at bedtime as needed.    . metFORMIN (GLUCOPHAGE) 1000 MG tablet Take 1,000 mg by mouth 2 (two) times daily with a meal.    . Multiple Vitamins-Minerals (CENTRUM ADULTS) TABS Take 1 tablet by mouth once.    . naproxen sodium (ALEVE) 220 MG tablet as needed.    . rosuvastatin (CRESTOR) 20 MG tablet Take 20 mg by mouth daily.    . sildenafil (VIAGRA) 100 MG tablet Take 100 mg by mouth as needed.    . valsartan-hydrochlorothiazide (DIOVAN-HCT) 160-12.5 MG tablet Take 1 tablet by mouth daily.    Marland Kitchen zolpidem (AMBIEN) 10 MG tablet Take 10 mg by mouth at bedtime as needed for sleep.     No current facility-administered medications for this visit.    PAST MEDICAL HISTORY: Past Medical History:  Diagnosis Date  . Chronic back pain   . Colon polyp   . Diabetes (  HCC)   . Elevated PSA   . Gout   . Heel spur   . Hyperlipemia   . Hypertension   . Insomnia   . Kidney stone   . Neuropathy   . PVC (premature ventricular contraction)   . Stroke [facility])     PAST SURGICAL HISTORY: Past Surgical History:  Procedure Laterality Date  . CARDIOVASCULAR STRESS TEST    . COLONOSCOPY  09/2015  . 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 ESOPHAGOGASTRODUODENOSCOPY    . HERNIA REPAIR  8756   umbilicial  . holter mon    . PENECTOMY  1995  . TONSILLECTOMY      FAMILY HISTORY: Family History  Problem Relation Age of Onset  . Colon cancer Mother        in late 4's  . Diabetes Mother   . Hypertension Mother   . Stroke Father     SOCIAL HISTORY: Social History   Socioeconomic History  . Marital status: Married    Spouse name: irene  . Number of children: 1  . Years of education: college  . Highest education level: Professional school degree (e.g., MD, DDS, DVM, JD)  Occupational History  . Occupation: retired crim atty  Tobacco Use  . Smoking status: Former Research scientist (life sciences)  . Smokeless tobacco: Never Used  Vaping Use  . Vaping Use:  Never used  Substance and Sexual Activity  . Alcohol use: Yes    Comment: occ  . Drug use: Yes    Types: Marijuana    Comment: rare  . Sexual activity: Not on file  Other Topics Concern  . Not on file  Social History Narrative   Lives at home with his wife.   Right-handed.   Caffeine use: 4 diet sodas per day.   Social Determinants of Health   Financial Resource Strain: Not on file  Food Insecurity: Not on file  Transportation Needs: Not on file  Physical Activity: Not on file  Stress: Not on file  Social Connections: Not on file  Intimate Partner Violence: Not on file     Total time spent reviewing the chart, obtaining history, examined patient, ordering tests, documentation, consultations and family, care coordination was 12   . Marcial Pacas, M.D. Ph.D.  [facility] [address] [place], [place] [address] Ph: [phone] Fax: [phone]  CC:  Velna Hatchet, MD [place],  [place] [address]  Velna Hatchet, MD

## 2021-02-21 ENCOUNTER — Telehealth: Payer: Self-pay | Admitting: Neurology

## 2021-02-21 LAB — HOMOCYSTEINE: Homocysteine: 15.8 umol/L (ref 0.0–19.2)

## 2021-02-21 LAB — CBC WITH DIFFERENTIAL/PLATELET
Basophils Absolute: 0 10*3/uL (ref 0.0–0.2)
Basos: 1 %
EOS (ABSOLUTE): 0.2 10*3/uL (ref 0.0–0.4)
Eos: 3 %
Hematocrit: 35.2 % — ABNORMAL LOW (ref 37.5–51.0)
Hemoglobin: 11.7 g/dL — ABNORMAL LOW (ref 13.0–17.7)
Immature Grans (Abs): 0 10*3/uL (ref 0.0–0.1)
Immature Granulocytes: 0 %
Lymphocytes Absolute: 1.8 10*3/uL (ref 0.7–3.1)
Lymphs: 32 %
MCH: 28.8 pg (ref 26.6–33.0)
MCHC: 33.2 g/dL (ref 31.5–35.7)
MCV: 87 fL (ref 79–97)
Monocytes Absolute: 0.5 10*3/uL (ref 0.1–0.9)
Monocytes: 10 %
Neutrophils Absolute: 3 10*3/uL (ref 1.4–7.0)
Neutrophils: 54 %
Platelets: 150 10*3/uL (ref 150–450)
RBC: 4.06 x10E6/uL — ABNORMAL LOW (ref 4.14–5.80)
RDW: 13.9 % (ref 11.6–15.4)
WBC: 5.6 10*3/uL (ref 3.4–10.8)

## 2021-02-21 LAB — C-REACTIVE PROTEIN: CRP: <1 mg/L (ref 0–10)

## 2021-02-21 LAB — TSH: TSH: 2.17 u[IU]/mL (ref 0.450–4.500)

## 2021-02-21 LAB — VITAMIN D 25 HYDROXY (VIT D DEFICIENCY, FRACTURES): Vit D, 25-Hydroxy: 73.5 ng/mL (ref 30.0–100.0)

## 2021-02-21 LAB — CK: Total CK: 150 U/L (ref 41–331)

## 2021-02-21 LAB — ANA W/REFLEX IF POSITIVE: Anti Nuclear Antibody (ANA): NEGATIVE

## 2021-02-21 LAB — FOLATE: Folate: >20 ng/mL (ref >3.0)

## 2021-02-21 LAB — SEDIMENTATION RATE: Sed Rate: 2 mm/hr (ref 0–30)

## 2021-02-21 LAB — VITAMIN B12: Vitamin B-12: 384 pg/mL (ref 232–1245)

## 2021-02-21 NOTE — Telephone Encounter (Signed)
I spoke to the patient and he verbalized understanding of the findings.

## 2021-02-21 NOTE — Telephone Encounter (Addendum)
Reports mild, intermittent headaches, 1-2 on pain scale. He is under added stress due to his recent stroke and his wife's cancer diagnosis. He said that are just annoying - only taking Tylenol about once per week. He will let us know if they get worse. Dr. Krista Blue is aware.

## 2021-02-21 NOTE — Telephone Encounter (Signed)
Please call patient, laboratory evaluation showed slightly decreased hemoglobin 11.7, which is about his baseline level, rest of the laboratory evaluation was otherwise normal,

## 2021-02-22 ENCOUNTER — Telehealth: Payer: Self-pay | Admitting: Interventional Cardiology

## 2021-02-22 ENCOUNTER — Other Ambulatory Visit (HOSPITAL_COMMUNITY): Payer: Medicare Other

## 2021-02-22 NOTE — Telephone Encounter (Signed)
Spoke with pt and answered all questions about monitor including how long to wear it, where to place it, activities he can and cannot do, etc.  Pt appreciative for call.

## 2021-02-22 NOTE — Telephone Encounter (Signed)
PT is calling in with questions about the heart monitor tha he is being asked to wear.

## 2021-02-26 ENCOUNTER — Ambulatory Visit (HOSPITAL_COMMUNITY)
Admission: RE | Admit: 2021-02-26 | Discharge: 2021-02-26 | Disposition: A | Discharge status: Home / Self Care | Payer: Medicare Other | Source: Ambulatory Visit | Attending: Interventional Cardiology | Admitting: Interventional Cardiology

## 2021-02-26 ENCOUNTER — Other Ambulatory Visit: Payer: Self-pay

## 2021-02-26 DIAGNOSIS — E785 Hyperlipidemia, unspecified: Secondary | ICD-10-CM | POA: Diagnosis not present

## 2021-02-26 DIAGNOSIS — I6389 Other cerebral infarction: Secondary | ICD-10-CM

## 2021-02-26 DIAGNOSIS — I10 Essential (primary) hypertension: Secondary | ICD-10-CM | POA: Insufficient documentation

## 2021-02-26 DIAGNOSIS — E119 Type 2 diabetes mellitus without complications: Secondary | ICD-10-CM | POA: Insufficient documentation

## 2021-02-26 DIAGNOSIS — I639 Cerebral infarction, unspecified: Secondary | ICD-10-CM | POA: Insufficient documentation

## 2021-02-26 DIAGNOSIS — I082 Rheumatic disorders of both aortic and tricuspid valves: Secondary | ICD-10-CM | POA: Diagnosis not present

## 2021-02-26 LAB — ECHOCARDIOGRAM COMPLETE
Area-P 1/2: 3.23 cm2
P 1/2 time: 547 msec
S' Lateral: 3.5 cm

## 2021-02-26 NOTE — Progress Notes (Signed)
  Echocardiogram 2D Echocardiogram has been performed.  Jennette Dubin 02/26/2021, 3:45 PM

## 2021-02-27 ENCOUNTER — Ambulatory Visit (INDEPENDENT_AMBULATORY_CARE_PROVIDER_SITE_OTHER): Payer: Medicare Other

## 2021-02-27 ENCOUNTER — Telehealth: Payer: Self-pay | Admitting: *Deleted

## 2021-02-27 DIAGNOSIS — R42 Dizziness and giddiness: Secondary | ICD-10-CM | POA: Diagnosis not present

## 2021-02-27 DIAGNOSIS — I639 Cerebral infarction, unspecified: Secondary | ICD-10-CM

## 2021-02-27 NOTE — Telephone Encounter (Signed)
Patient had contacted [employer] with his immediate questions.  Reviewed  monitor functions with patient.

## 2021-03-13 ENCOUNTER — Other Ambulatory Visit (HOSPITAL_COMMUNITY): Payer: Medicare Other

## 2021-03-15 ENCOUNTER — Telehealth: Payer: Self-pay | Admitting: *Deleted

## 2021-03-15 NOTE — Telephone Encounter (Signed)
PT is requesting to speak w/ Nurse Fraser Din in regards to an afib monitor he's had since Monday... Pt would like results.. please advise

## 2021-03-15 NOTE — Telephone Encounter (Signed)
Reviewed with monitor department and we have not received any readings of afib or calls from the monitor company.  Final report not available yet.  I spoke with patient who is calling to get results.  I gave him above information and let him know we would call him when final report reviewed by Dr Irish Lack

## 2021-03-19 NOTE — Telephone Encounter (Signed)
He is now having some type of headache daily.Pain is still at 2 on the pain scale. Tylenol provides temporary relief. He is using it most days. I educated him about rebound headaches and instructed him to hold off taking. It should only be used for his more severe pain. He verbalized understanding. He would like to trying this first before asking Dr. Krista Blue for a preventive medication. If they continue to be problematic, he will call us back.

## 2021-03-19 NOTE — Telephone Encounter (Signed)
Pt called, I'm still having headaches. Would like a call from the nurse.

## 2021-03-20 ENCOUNTER — Other Ambulatory Visit: Payer: Self-pay | Admitting: Interventional Cardiology

## 2021-03-20 DIAGNOSIS — I639 Cerebral infarction, unspecified: Secondary | ICD-10-CM

## 2021-03-20 DIAGNOSIS — R42 Dizziness and giddiness: Secondary | ICD-10-CM

## 2021-03-22 MED ORDER — GABAPENTIN 600 MG PO TABS: 600.0000 mg | ORAL_TABLET | Freq: Three times a day (TID) | ORAL | 3 refills | Status: AC

## 2021-03-22 NOTE — Telephone Encounter (Signed)
Per vo by Dr. Krista Blue, provide new prescription for gabapentin 600mg , one cap TID. The patient is agreeable to this plan. He will call back for any further concerns.

## 2021-03-22 NOTE — Telephone Encounter (Signed)
He is currently on gabapentin 400mg , one cap BID and gabapentin 100mg , one cap BID (total of 500mg  BID). He has been on this dosage for many years for neuropathy.

## 2021-03-22 NOTE — Addendum Note (Signed)
Addended by: Desmond Lope on: 03/22/2021 05:33 PM   Modules accepted: Orders

## 2021-03-22 NOTE — Telephone Encounter (Signed)
The patient would like a call from [place]. He would like to discuss the cause of his stroke. Also, review the cardiac testing completed and to see if anything further should be ordered.  He is aware Dr. Krista Blue is out of the office until 03/27/21. Says this is non-urgent and a call next week will be fine.   He will be traveling to [place] on 03/27/21. He would like a call after 3pm or the following day.

## 2021-03-22 NOTE — Telephone Encounter (Signed)
Pt has called back stating he would now like something called in for his headaches, please call.

## 2021-04-02 NOTE — Telephone Encounter (Signed)
I have called patient, onset all related questions, he continues to have intermittent mild unsteady sensation, reviewed MRI of the brain result, only mild small stroke involving bilateral cerebellum, he is under tremendous stress, taking care of his wife, who suffered cancer  Echocardiogram showed no significant abnormality  Advised him to continue moderate exercise, increase water intake

## 2022-04-10 ENCOUNTER — Other Ambulatory Visit (HOSPITAL_COMMUNITY): Payer: Self-pay | Admitting: Internal Medicine

## 2022-04-10 DIAGNOSIS — R0609 Other forms of dyspnea: Secondary | ICD-10-CM

## 2022-04-12 ENCOUNTER — Ambulatory Visit (HOSPITAL_COMMUNITY): Payer: Medicare Other | Attending: Internal Medicine

## 2022-04-12 DIAGNOSIS — R0609 Other forms of dyspnea: Secondary | ICD-10-CM | POA: Insufficient documentation

## 2022-04-12 LAB — ECHOCARDIOGRAM COMPLETE
Area-P 1/2: 3.21 cm2
P 1/2 time: 409 msec
S' Lateral: 3.1 cm

## 2022-04-15 ENCOUNTER — Other Ambulatory Visit (HOSPITAL_BASED_OUTPATIENT_CLINIC_OR_DEPARTMENT_OTHER): Payer: Medicare Other

## 2022-07-01 ENCOUNTER — Telehealth: Payer: Self-pay

## 2022-07-01 ENCOUNTER — Other Ambulatory Visit (INDEPENDENT_AMBULATORY_CARE_PROVIDER_SITE_OTHER): Payer: Medicare Other

## 2022-07-01 ENCOUNTER — Ambulatory Visit: Payer: Medicare Other | Admitting: Nurse Practitioner

## 2022-07-01 ENCOUNTER — Encounter: Payer: Self-pay | Admitting: Nurse Practitioner

## 2022-07-01 DIAGNOSIS — D649 Anemia, unspecified: Secondary | ICD-10-CM

## 2022-07-01 LAB — CBC
HCT: 33.3 % — ABNORMAL LOW (ref 39.0–52.0)
Hemoglobin: 11.3 g/dL — ABNORMAL LOW (ref 13.0–17.0)
MCHC: 34 g/dL (ref 30.0–36.0)
MCV: 87.9 fl (ref 78.0–100.0)
Platelets: 151 10*3/uL (ref 150.0–400.0)
RBC: 3.78 Mil/uL — ABNORMAL LOW (ref 4.22–5.81)
RDW: 14.4 % (ref 11.5–15.5)
WBC: 8.6 10*3/uL (ref 4.0–10.5)

## 2022-07-01 LAB — BASIC METABOLIC PANEL
BUN: 36 mg/dL — ABNORMAL HIGH (ref 6–23)
CO2: 29 mEq/L (ref 19–32)
Calcium: 9.9 mg/dL (ref 8.4–10.5)
Chloride: 99 mEq/L (ref 96–112)
Creatinine, Ser: 1.16 mg/dL (ref 0.40–1.50)
GFR: 59.28 mL/min — ABNORMAL LOW (ref >60.00)
Glucose, Bld: 209 mg/dL — ABNORMAL HIGH (ref 70–99)
Potassium: 3.6 mEq/L (ref 3.5–5.1)
Sodium: 138 mEq/L (ref 135–145)

## 2022-07-01 LAB — IBC + FERRITIN
Ferritin: 104.2 ng/mL (ref 22.0–322.0)
Iron: 112 ug/dL (ref 42–165)
Saturation Ratios: 33.1 % (ref 20.0–50.0)
TIBC: 338.8 ug/dL (ref 250.0–450.0)
Transferrin: 242 mg/dL (ref 212.0–360.0)

## 2022-07-01 MED ORDER — PLENVU 140 G PO SOLR: 1.0000 | ORAL | 0 refills | Status: DC

## 2022-07-01 NOTE — Patient Instructions (Signed)
Your provider has requested that you go to the basement level for lab work before leaving today. Press "B" on the elevator. The lab is located at the first door on the left as you exit the elevator.  Due to recent changes in healthcare laws, you may see the results of your imaging and laboratory studies on MyChart before your provider has had a chance to review them.  We understand that in some cases there may be results that are confusing or concerning to you. Not all laboratory results come back in the same time frame and the provider may be waiting for multiple results in order to interpret others.  Please give [place] 48 hours in order for your provider to thoroughly review all the results before contacting the office for clarification of your results.   You have been scheduled for an endoscopy and colonoscopy. Please follow the written instructions given to you at your visit today. Please pick up your prep supplies at the pharmacy within the next 1-3 days. If you use inhalers (even only as needed), please bring them with you on the day of your procedure.  You will be contacted by our office prior to your procedure for directions on holding your Plavix.  If you do not hear from our office 1 week prior to your scheduled procedure, please call [phone] to discuss.   I appreciate the opportunity to care for you. Carl Best, CRNP

## 2022-07-01 NOTE — Telephone Encounter (Signed)
   Aaron Oliver 10/30/1940 [id number]  Dear Dr Marcial Pacas,  We have scheduled the above named patient for a(n) Colonoscopy/EGD procedure. Our records show that (s)he is on anticoagulation therapy.  Please advise as to whether the patient may come off their therapy of Plavix 5 days prior to their procedure which is scheduled for 07/26/2022.  Please route your response to Olon Russ [place], [place] ([place])  or fax response to [phone].  Sincerely,    [facility]

## 2022-07-01 NOTE — Progress Notes (Signed)
____________________________________________________________  Attending physician addendum:  Thank you for sending this case to me. I have reviewed the entire note and agree with the plan.  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  Nevertheless, given his chronic use of antiplatelet agent, reported weight loss and anemia, endoscopic evaluation is warranted.  Wilfrid Lund, MD  ____________________________________________________________

## 2022-07-01 NOTE — Progress Notes (Signed)
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  He was initially seen by Dr. Loletha Carrow 09/19/2019 due to having a thrombosed hemorrhoid which resolved after he took a stool softener and increase his dietary fiber intake.  He presents today as referred by Dr. Velna Hatchet for further evaluation regarding anemia.  He reported having a history of anemia for the past 20 years.  He started taking oral iron 325 mg once daily 1 or 2 years ago.  He underwent laboratory studies 05/29/2022 which showed a hemoglobin level of 10.3 ( Hg 11.7 on 02/20/2021).  He denies having any dysphagia, heartburn or upper abdominal pain.  He passes a normal formed brown stool 6 days weekly and has diarrhea once weekly which is his typical bowel pattern.  No rectal bleeding or black stools.  His stools floated for about 1 week 4 to 5 months ago which resolved.  He has a history of colon polyps.  His most recent colonoscopy in [place] was in 2017 which he reported was normal, no polyps.  A colonoscopy in 2012 possibly showed a few benign polyps. His mother was diagnosed with colon cancer in her late 64s.  He underwent an EGD a long time ago, no significant findings.  No history of ulcers or GI bleed.  He is on Plavix daily secondary to having a CVA 03/2021.  No ASA or other NSAID use.  He has new onset lower back pain for which he was prescribed a 6-day course of Prednisone which he will finish in 3 days.  His appetite has decreased and he has lost a few pounds which he attributes to worrying about his wife who has cancer.  Weight 185lbs 01/2021. Today's weight 175lbs. He and his wife travel to [place] every 2 weeks for her cancer treatment since 08/2021. He feels warn  down at times but remains active.   Labs 05/29/2022: BUN 29. Cr 1.3. T.bili 0.7. Alk phos 54. AST 23. ALT 24. Hg 10.3. Hct 28.9. MCV 88.4. PLT 122.  Iron levels not done.  Labs 02/20/2021: Hg 11.7. HCT 355.2.  B12 384.  Labs 05/16/2020: Hemoglobin 11.9.  MCV 87.6.  Platelet 128.  ECHO 04/12/2022: IMPRESSIONS Left ventricular ejection fraction, by estimation, is 55 to 60%. The left ventricle has normal function. The left ventricle has no regional wall motion abnormalities. Left ventricular diastolic parameters are consistent with Grade I diastolic dysfunction (impaired relaxation). 1. Right ventricular systolic function is normal. The right ventricular size is normal. There is mildly elevated pulmonary artery systolic pressure. 2. The mitral valve is normal in structure. No evidence of mitral valve regurgitation. No evidence of mitral stenosis. 3. 4. The aortic valve is tricuspid. Aortic valve regurgitation is mild. Aortic dilatation noted. There is mild dilatation of the aortic root, measuring 42 mm. There is mild dilatation of the ascending aorta, measuring 42 mm.  Brain MRI 02/13/2021: Few small acute to subacute bilateral cerebellar infarcts.   Diminished flow void of the left vertebral artery is probably due to congenitally diminutive size. Vascular imaging can be obtained as indicated.   Small chronic left cerebellar infarct. Minor chronic microvascular ischemic changes.  Past Medical History:  Diagnosis Date   Chronic  back pain    Colon polyp    Diabetes ([place])    Elevated PSA    Gout    Heel spur    Hyperlipemia    Hypertension    Insomnia    Kidney stone    Neuropathy    PVC (premature ventricular contraction)    Stroke [facility])    Past Surgical History:  Procedure Laterality Date   CARDIOVASCULAR STRESS TEST     COLONOSCOPY  09/2015   CYSTECTOMY  2000   cyst on nerve on his spine   ESOPHAGOGASTRODUODENOSCOPY     HERNIA REPAIR  3295   umbilicial   holter mon      PENECTOMY  1995   TONSILLECTOMY         Current Outpatient Medications on File Prior to Visit  Medication Sig Dispense Refill   allopurinol (ZYLOPRIM) 100 MG tablet Take 100 mg by mouth daily.     Alpha-Lipoic Acid 600 MG CAPS Take 1 capsule by mouth once.     carvedilol (COREG) 12.5 MG tablet Take 12.5 mg by mouth 2 (two) times daily with a meal.     Cholecalciferol (VITAMIN D3) 50 MCG (2000 UT) TABS Take by mouth once.     clopidogrel (PLAVIX) 75 MG tablet Take 1 tablet by mouth daily.     colchicine 0.6 MG tablet Take 0.6 mg by mouth daily.     Ferrous Sulfate (IRON) 325 (65 Fe) MG TABS Take by mouth once.     gabapentin (NEURONTIN) 600 MG tablet Take 1 tablet (600 mg total) by mouth 3 (three) times daily. 270 tablet 3   LORazepam (ATIVAN) 1 MG tablet Take 1 mg by mouth at bedtime as needed.     metFORMIN (GLUCOPHAGE) 1000 MG tablet Take 1,000 mg by mouth 2 (two) times daily with a meal.     Multiple Vitamins-Minerals (CENTRUM ADULTS) TABS Take 1 tablet by mouth once.     rosuvastatin (CRESTOR) 20 MG tablet Take 20 mg by mouth daily.     sildenafil (VIAGRA) 100 MG tablet Take 100 mg by mouth as needed.     valsartan-hydrochlorothiazide (DIOVAN-HCT) 160-12.5 MG tablet Take 1 tablet by mouth daily.     zolpidem (AMBIEN) 10 MG tablet Take 10 mg by mouth at bedtime as needed for sleep.     naproxen sodium (ALEVE) 220 MG tablet as needed. (Patient not taking: Reported on 07/01/2022)     No current facility-administered medications on file prior to visit.   No Known Allergies  Current Medications, Allergies, Past Medical History, Past Surgical History, Family History and Social History were reviewed in [employer] record.  Review of Systems:   Constitutional: Few pounds of  weight loss.  Negative for fever, sweats or chills. Respiratory: Negative for shortness of breath.   Cardiovascular: Negative for chest pain, palpitations and leg swelling.   Gastrointestinal: See HPI.  Musculoskeletal: Back pain.  Neurological: + Neuropathy.   Physical Exam: BP (!) 140/60 (BP Location: Left Arm, Patient Position: Sitting, Cuff Size: Normal)   Pulse 83   Ht _0  (1.778 m)   Wt 175 lb 6.4 oz (79.6 kg)   SpO2 95%   BMI 25.17 kg/m  Wt Readings from Last 3 Encounters:  07/01/22 175 lb 6.4 oz (79.6 kg)  02/20/21 185 lb (83.9 kg)  02/14/21 183 lb 3.2 oz (83.1 kg)    General: 81 year old male in no acute distress. Head: Normocephalic and atraumatic. Eyes: No scleral icterus. Conjunctiva pink .  Ears: Normal auditory acuity. Mouth: Dentition intact. No ulcers or lesions.  Lungs: Clear throughout to auscultation. Heart: Regular rate and rhythm, no murmur. Abdomen: Soft, nontender and nondistended. No masses or hepatomegaly. Normal bowel sounds x 4 quadrants.  Rectal: Deferred. Musculoskeletal: Symmetrical with no gross deformities. Extremities: No edema. Neurological: Alert oriented x 4. No focal deficits.  Psychological: Alert and cooperative. Normal mood and affect  Assessment and Recommendations:  75) 81 year old male with chronic anemia for "20 years". On Ferrous Sulfate for 1 to 2 years. Hg 10.3 down from his baseline Hg 11.7.  Mildly elevated BUN 29.  No overt GI bleeding, however, potentially at an increased risk of GI bleeding as he is on Plavix. -CBC, BMP and IBC + ferritin  -EGD and colonoscopy benefits and risks discussed including risk with sedation, risk of bleeding, perforation and infection   2) Personal history of colon polyps. Mother diagnosed with colon cancer in her late 48's. Last colonoscopy in [place] was done in 2017, reported as normal per the patient.  -Colonoscopy as ordered above   3) CVA 03/2021 on Plavix  -Our office will contact neurologist Dr. Krista Blue to verify Plavix instructions prior to proceeding with EGD and colonoscopy  4) 10 lbs weigh loss over the past 17 months, decreased appetite.   -Recommend CTAP  if weight loss persists   5) Lower back pain, On short course of Prednisone -I discussed taking Prilosec 20 mg once daily when taking Prednisone  6) Mild thrombocytopenia.  No history of liver disease. -Await CBC result

## 2022-07-02 NOTE — Telephone Encounter (Signed)
Aaron Oliver informed to hold the Plavix 5 days prior to his ECL. He verbalized understanding and I told him to increase his water intake as the Dr.'s note stated. He will try he said.

## 2022-07-02 NOTE — Telephone Encounter (Signed)
To [facility]:  I saw patient on Feb 20, 2021 following his stroke few days prior to that, which is demonstrated by MRI of the brain on Feb 13, 2021, small vessel acute stroke involving bilateral cerebellum.  MRA of brain and neck showed no significant carotid artery stenosis, no flow identifiable in left vertebral artery, 30% narrowing at the dominant right vertebral artery.  Echocardiogram showed no significant normality  Cardiac monitoring in June 2022 showed no significant abnormality.  Patient has been on Plavix 75 mg daily since his stroke.  It is okay to stop Plavix 5 days prior to scheduled colonoscopy  Please advise patient to increase water intake, and restart Plavix as soon as deemed safe.  He is at risk for recurrent thrombotic event,   Aaron Oliver, M.D. Ph.D.  [facility] [place], [place] [address] Phone: [phone] Fax:      [phone]

## 2022-07-23 ENCOUNTER — Encounter: Payer: Medicare Other | Admitting: Gastroenterology

## 2022-07-26 ENCOUNTER — Ambulatory Visit (AMBULATORY_SURGERY_CENTER): Payer: Medicare Other | Admitting: Gastroenterology

## 2022-07-26 ENCOUNTER — Encounter: Payer: Self-pay | Admitting: Gastroenterology

## 2022-07-26 VITALS — BP 143/56 | HR 71 | Temp 98.0°F | Resp 19 | Ht 70.0 in | Wt 175.0 lb

## 2022-07-26 DIAGNOSIS — D123 Benign neoplasm of transverse colon: Secondary | ICD-10-CM | POA: Diagnosis not present

## 2022-07-26 DIAGNOSIS — D649 Anemia, unspecified: Secondary | ICD-10-CM

## 2022-07-26 DIAGNOSIS — K5730 Diverticulosis of large intestine without perforation or abscess without bleeding: Secondary | ICD-10-CM

## 2022-07-26 DIAGNOSIS — D132 Benign neoplasm of duodenum: Secondary | ICD-10-CM

## 2022-07-26 DIAGNOSIS — D124 Benign neoplasm of descending colon: Secondary | ICD-10-CM

## 2022-07-26 DIAGNOSIS — R634 Abnormal weight loss: Secondary | ICD-10-CM

## 2022-07-26 DIAGNOSIS — D122 Benign neoplasm of ascending colon: Secondary | ICD-10-CM

## 2022-07-26 DIAGNOSIS — K6389 Other specified diseases of intestine: Secondary | ICD-10-CM

## 2022-07-26 MED ORDER — SODIUM CHLORIDE 0.9 % IV SOLN: 500.0000 mL | Freq: Once | INTRAVENOUS | Status: DC

## 2022-07-26 NOTE — Progress Notes (Signed)
Called to room to assist during endoscopic procedure.  Patient ID and intended procedure confirmed with present staff. Received instructions for my participation in the procedure from the performing physician.  

## 2022-07-26 NOTE — Op Note (Signed)
[facility] Patient Name: Aaron Oliver Procedure Date: 07/26/2022 2:03 PM MRN: [id number] Endoscopist: [place]. Loletha Carrow , MD, [id number] Age: 81 Referring MD:  Date of Birth: 04/02/1941 Gender: Male Account #: [id number] Procedure:                Colonoscopy Indications:              Unexplained anemia andthrombocytopenia - years),                            Weight loss (lately improved)                           reportedly last colon 2017 at outside clinic in [facility]                           mother with Mosier in her 29's Medicines:                Monitored Anesthesia Care Procedure:                Pre-Anesthesia Assessment:                           - Prior to the procedure, a History and Physical                            was performed, and patient medications and                            allergies were reviewed. The patient's tolerance of                            previous anesthesia was also reviewed. The risks                            and benefits of the procedure and the sedation                            options and risks were discussed with the patient.                            All questions were answered, and informed consent                            was obtained. Prior Anticoagulants: The patient has                            taken Plavix (clopidogrel), last dose was 5 days                            prior to procedure. ASA Grade Assessment: II - A                            patient with mild systemic disease. After reviewing  the risks and benefits, the patient was deemed in                            satisfactory condition to undergo the procedure.                           After obtaining informed consent, the colonoscope                            was passed under direct vision. Throughout the                            procedure, the patient's blood pressure, pulse, and                            oxygen saturations were monitored  continuously. The                            Olympus CF-HQ190L ([id number]) Colonoscope was                            introduced through the anus and advanced to the the                            cecum, identified by appendiceal orifice and                            ileocecal valve. The colonoscopy was performed                            without difficulty. The patient tolerated the                            procedure well. The quality of the bowel                            preparation was good except the sigmoid colon was                            fair. The ileocecal valve, appendiceal orifice, and                            rectum were photographed. Scope In: 2:25:38 PM Scope Out: 2:53:44 PM Scope Withdrawal Time: 0 hours 22 minutes 47 seconds  Total Procedure Duration: 0 hours 28 minutes 6 seconds  Findings:                 The perianal and digital rectal examinations were                            normal.                           Four sessile polyps were found in the ascending  colon. The polyps were 3 to 10 mm in size. These                            polyps were removed with a cold snare. Resection                            and retrieval were complete. (Jar 2)                           Three sessile polyps were found in the proximal                            transverse colon and ascending colon. The polyps                            were 5 to 15 mm in size (largest one piecemeal).                            These polyps were removed with a cold snare.                            Resection and retrieval were complete. (Jar 3)                           Four sessile polyps were found in the transverse                            colon. The polyps were 8 to 10 mm in size. These                            polyps were removed with a cold snare. Resection                            and retrieval were complete. (Jar 4)                           Two  semi-sessile polyps were found in the distal                            transverse colon. The polyps were approx 20 and                            35-40 mm in size. Neither were removed (EMR                            required)                           Four sessile polyps were found in the descending                            colon. The polyps were 8 to 10 mm in size. These  polyps were removed with a cold snare. Resection                            and retrieval were complete. (Jar 5)                           There were several additional sessile and                            peundculated polyps in the left colon randing from                            approx. 8-9m in size. None were removed.(repeat                            scope needed for EMR largest polyp and risk of                            bleeding with multiple polypectomies)                           Diverticula were found in the entire colon.                           The exam was otherwise without abnormality on                            direct and retroflexion views. Complications:            No immediate complications. Estimated Blood Loss:     Estimated blood loss was minimal. Impression:               - Four 3 to 10 mm polyps in the ascending colon,                            removed with a cold snare. Resected and retrieved.                           - Three 5 to 15 mm polyps in the proximal                            transverse colon and in the ascending colon,                            removed with a cold snare. Resected and retrieved.                           - Four 8 to 10 mm polyps in the transverse colon,                            removed with a cold snare. Resected and retrieved.                           - Two 40  mm polyps in the distal transverse colon.                           - Four 8 to 10 mm polyps in the descending colon,                            removed with a cold  snare. Resected and retrieved.                           - Diverticulosis in the entire examined colon.                           - The examination was otherwise normal on direct                            and retroflexion views. Recommendation:           - Patient has a contact number available for                            emergencies. The signs and symptoms of potential                            delayed complications were discussed with the                            patient. Return to normal activities tomorrow.                            Written discharge instructions were provided to the                            patient.                           - Resume previous diet.                           - Resume Plavix (clopidogrel) at prior dose in 3                            days.                           - Await pathology results.                           - Repeat colonoscopy at appointment to be scheduled                            for removal of additonal polyps, including EMR for                            largest TC polyp.                           -  See the other procedure note for documentation of                            additional recommendations. Chelbie Jarnagin L. Loletha Carrow, MD 07/26/2022 3:08:19 PM This report has been signed electronically.

## 2022-07-26 NOTE — Patient Instructions (Addendum)
Handouts on polyps and diverticulosis given to you today  Await pathology results  Resume Plavix in 3 days (on 11/6)  YOU HAD AN ENDOSCOPIC PROCEDURE TODAY AT [facility]:   Refer to the procedure report that was given to you for any specific questions about what was found during the examination.  If the procedure report does not answer your questions, please call your gastroenterologist to clarify.  If you requested that your care partner not be given the details of your procedure findings, then the procedure report has been included in a sealed envelope for you to review at your convenience later.  YOU SHOULD EXPECT: Some feelings of bloating in the abdomen. Passage of more gas than usual.  Walking can help get rid of the air that was put into your GI tract during the procedure and reduce the bloating. If you had a lower endoscopy (such as a colonoscopy or flexible sigmoidoscopy) you may notice spotting of blood in your stool or on the toilet paper. If you underwent a bowel prep for your procedure, you may not have a normal bowel movement for a few days.  Please Note:  You might notice some irritation and congestion in your nose or some drainage.  This is from the oxygen used during your procedure.  There is no need for concern and it should clear up in a day or so.  SYMPTOMS TO REPORT IMMEDIATELY:  Following lower endoscopy (colonoscopy or flexible sigmoidoscopy):  Excessive amounts of blood in the stool  Significant tenderness or worsening of abdominal pains  Swelling of the abdomen that is new, acute  Fever of 100F or higher  Following upper endoscopy (EGD)  Vomiting of blood or coffee ground material  New chest pain or pain under the shoulder blades  Painful or persistently difficult swallowing  New shortness of breath  Fever of 100F or higher  Black, tarry-looking stools  For urgent or emergent issues, a gastroenterologist can be reached at any hour by calling  [phone]. Do not use MyChart messaging for urgent concerns.    DIET:  We do recommend a small meal at first, but then you may proceed to your regular diet.  Drink plenty of fluids but you should avoid alcoholic beverages for 24 hours.  ACTIVITY:  You should plan to take it easy for the rest of today and you should NOT DRIVE or use heavy machinery until tomorrow (because of the sedation medicines used during the test).    FOLLOW UP: Our staff will call the number listed on your records the next business day following your procedure.  We will call around 7:15- 8:00 am to check on you and address any questions or concerns that you may have regarding the information given to you following your procedure. If we do not reach you, we will leave a message.     If any biopsies were taken you will be contacted by phone or by letter within the next 1-3 weeks.  Please call us at [phone] if you have not heard about the biopsies in 3 weeks.   Need repeat colonoscopy in the hospital to take off remaining polyps   SIGNATURES/CONFIDENTIALITY: You and/or your care partner have signed paperwork which will be entered into your electronic medical record.  These signatures attest to the fact that that the information above on your After Visit Summary has been reviewed and is understood.  Full responsibility of the confidentiality of this discharge information lies with you  and/or your care-partner. 

## 2022-07-26 NOTE — Progress Notes (Signed)
No changes to clinical history since GI office visit on 07/01/22.  The patient is appropriate for an endoscopic procedure in the ambulatory setting.  - Wilfrid Lund, MD

## 2022-07-26 NOTE — Op Note (Signed)
[facility] Patient Name: Aaron Oliver Procedure Date: 07/26/2022 2:04 PM MRN: [id number] Endoscopist: [facility] [place]. Loletha Carrow , MD, [id number] Age: 81 Referring MD:  Date of Birth: 07/16/1941 Gender: Male Account #: [id number] Procedure:                Upper GI endoscopy Indications:              Unexplained anemia (and thrombocytopenia - years),                            Weight loss (recently improved) Medicines:                Monitored Anesthesia Care Procedure:                Pre-Anesthesia Assessment:                           - Prior to the procedure, a History and Physical                            was performed, and patient medications and                            allergies were reviewed. The patient's tolerance of                            previous anesthesia was also reviewed. The risks                            and benefits of the procedure and the sedation                            options and risks were discussed with the patient.                            All questions were answered, and informed consent                            was obtained. Prior Anticoagulants: The patient has                            taken Plavix (clopidogrel), last dose was 5 days                            prior to procedure. ASA Grade Assessment: II - A                            patient with mild systemic disease. After reviewing                            the risks and benefits, the patient was deemed in                            satisfactory condition to undergo the procedure.  After obtaining informed consent, the endoscope was                            passed under direct vision. Throughout the                            procedure, the patient's blood pressure, pulse, and                            oxygen saturations were monitored continuously. The                            Endoscope was introduced through the mouth, and                             advanced to the second part of duodenum. The upper                            GI endoscopy was accomplished without difficulty.                            The patient tolerated the procedure well. Scope In: Scope Out: Findings:                 The esophagus was normal.                           The stomach was normal.                           The cardia and gastric fundus were normal on                            retroflexion.                           A single diminutive sessile polyp was found in the                            second portion of the duodenum. The polyp was                            removed with a piecemeal technique using a cold                            biopsy forceps. Resection and retrieval were                            complete.                           The exam of the duodenum was otherwise normal. Complications:            No immediate complications. Estimated Blood Loss:     Estimated blood loss was minimal. Impression:               -  Normal esophagus.                           - Normal stomach.                           - A single duodenal polyp. Resected and retrieved.                           No source of blood loss or weight loss seen.                            Incidental diminnutive duodenal polyp removed. Recommendation:           - Patient has a contact number available for                            emergencies. The signs and symptoms of potential                            delayed complications were discussed with the                            patient. Return to normal activities tomorrow.                            Written discharge instructions were provided to the                            patient.                           - Resume previous diet.                           - Continue present medications.                           - Await pathology results.                           - See the other procedure note for documentation of                             additional recommendations. Aaron Oliver L. Loletha Carrow, MD 07/26/2022 2:24:13 PM This report has been signed electronically.

## 2022-07-26 NOTE — Progress Notes (Signed)
A/ox3, pleased with MAC, report to RN 

## 2022-07-26 NOTE — Progress Notes (Signed)
Pt's states no medical or surgical changes since previsit or office visit. 

## 2022-07-29 ENCOUNTER — Telehealth: Payer: Self-pay | Admitting: *Deleted

## 2022-07-29 NOTE — Telephone Encounter (Signed)
Post procedure follow up call placed, no answer and left VM.  

## 2022-08-01 ENCOUNTER — Encounter: Payer: Self-pay | Admitting: Gastroenterology

## 2022-08-05 ENCOUNTER — Other Ambulatory Visit: Payer: Self-pay

## 2022-08-05 ENCOUNTER — Encounter: Payer: Self-pay | Admitting: Interventional Cardiology

## 2022-08-05 ENCOUNTER — Ambulatory Visit: Payer: Medicare Other | Attending: Interventional Cardiology | Admitting: Interventional Cardiology

## 2022-08-05 VITALS — BP 128/50 | HR 79 | Ht 70.0 in | Wt 180.0 lb

## 2022-08-05 DIAGNOSIS — I10 Essential (primary) hypertension: Secondary | ICD-10-CM | POA: Diagnosis not present

## 2022-08-05 DIAGNOSIS — E1159 Type 2 diabetes mellitus with other circulatory complications: Secondary | ICD-10-CM

## 2022-08-05 DIAGNOSIS — D123 Benign neoplasm of transverse colon: Secondary | ICD-10-CM

## 2022-08-05 DIAGNOSIS — I639 Cerebral infarction, unspecified: Secondary | ICD-10-CM

## 2022-08-05 NOTE — Patient Instructions (Signed)
Medication Instructions:  Your physician recommends that you continue on your current medications as directed. Please refer to the Current Medication list given to you today.  *If you need a refill on your cardiac medications before your next appointment, please call your pharmacy*   Lab Work: none If you have labs (blood work) drawn today and your tests are completely normal, you will receive your results only by: [place] (if you have MyChart) OR A paper copy in the mail If you have any lab test that is abnormal or we need to change your treatment, we will call you to review the results.   Testing/Procedures: Your physician has requested that you have an echocardiogram. Echocardiography is a painless test that uses sound waves to create images of your heart. It provides your doctor with information about the size and shape of your heart and how well your heart's chambers and valves are working. This procedure takes approximately one hour. There are no restrictions for this procedure. Please do NOT wear cologne, perfume, aftershave, or lotions (deodorant is allowed). Please arrive 15 minutes prior to your appointment time. To be done in one year   Follow-Up: At [facility], you and your health needs are our priority.  As part of our continuing mission to provide you with exceptional heart care, we have created designated Provider Care Teams.  These Care Teams include your primary Cardiologist (physician) and Advanced Practice Providers (APPs -  Physician Assistants and Nurse Practitioners) who all work together to provide you with the care you need, when you need it.  We recommend signing up for the patient portal called "MyChart".  Sign up information is provided on this After Visit Summary.  MyChart is used to connect with patients for Virtual Visits (Telemedicine).  Patients are able to view lab/test results, encounter notes, upcoming appointments, etc.  Non-urgent  messages can be sent to your provider as well.   To learn more about what you can do with MyChart, go to [email or web address].    Your next appointment:   12 month(s)  The format for your next appointment:   In Person  Provider:   Larae Grooms, MD     Other Instructions    Important Information About Sugar

## 2022-08-05 NOTE — Progress Notes (Unsigned)
Cardiology Office Note   Date:  08/05/2022   ID:  Aaron Oliver, DOB 06/13/1941, MRN [id number]  PCP:  Aaron Hatchet, MD    No chief complaint on file.  Prior CVA  Wt Readings from Last 3 Encounters:  08/05/22 180 lb (81.6 kg)  07/26/22 175 lb (79.4 kg)  07/01/22 175 lb 6.4 oz (79.6 kg)       History of Present Illness: Aaron Oliver is a 81 y.o. male  is from [place] originally.  Lived in [place] or 40 years.  Moved to [place] in 2016.     In May 2022, he woke up with some balance issues and leg weakness.  He was under stress due to his wife's illness; she has cancer.     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   Diminished flow void of the left vertebral artery is probably due to congenitally diminutive size. Vascular imaging can be obtained as indicated.   Small chronic left cerebellar infarct. Minor chronic microvascular ischemic changes." MRA: "Aortic arch appears normal. Branching pattern of the brachiocephalic vessels is normal without origin stenosis.   Both common carotid arteries are widely patent to the bifurcation. Both carotid bifurcations appear normal. Both cervical internal carotid arteries are normal.   Dominant right vertebral artery shows 30% stenosis at its origin but is widely patent beyond that through the cervical region, through the foramen magnum to the basilar. No flow is seen in the proximal right vertebral artery. There appears to be a reconstituted small right vertebral artery beginning at about the C7 level, which shows definite flow going as high as C1. On some of the source images, there appears to be a small amount of flow beyond the foramen magnum to the basilar. I cannot tell if this is congenital or acquired Pathology."   No prior cardiac history.  Routine stress test in 2016 which was normal in [place].  Was going to the gym prior to [place].  Now exercises at home 5 days/week.     When he  does yard work, he feels a little dizziness. Mild DOE.    Quit smoking about 35 years ago.  Add a DVT.     Mother had an MI was she was 61.  Dad had a stroke at 22. No siblings.   Monitor in June 2022 showed: " Normal sinus rhythm with rare PACs and PVCs.  No atrial fibrillation."  Since the last visit.  No recurrent neuro sx.  Going to PT for spinal stenosis.  Was taking pain pills. Now improved.  Starting to walk again.  Has some left leg weakness.  Declined back surgery.  Wife with cancer and they go to [facility] for treatment.   2023 echo showed: "Left ventricular ejection fraction, by estimation, is 55 to 60%. The  left ventricle has normal function. The left ventricle has no regional  wall motion abnormalities. Left ventricular diastolic parameters are  consistent with Grade I diastolic  dysfunction (impaired relaxation).   2. Right ventricular systolic function is normal. The right ventricular  size is normal. There is mildly elevated pulmonary artery systolic  pressure.   3. The mitral valve is normal in structure. No evidence of mitral valve  regurgitation. No evidence of mitral stenosis.   4. The aortic valve is tricuspid. Aortic valve regurgitation is mild.   5. Aortic dilatation noted. There is mild dilatation of the aortic root,  measuring 42 mm. There is mild  dilatation of the ascending aorta,  measuring 42 mm.   Comparison(s): Stable AI with increase aortic root size. "  Denies : Chest pain. Dizziness. Leg edema. Nitroglycerin use. Orthopnea. Palpitations. Paroxysmal nocturnal dyspnea. Shortness of breath. Syncope.    Past Medical History:  Diagnosis Date   Chronic back pain    Colon polyp    Diabetes ([place])    Elevated PSA    Gout    Heel spur    Hyperlipemia    Hypertension    Insomnia    Kidney stone    Neuropathy    PVC (premature ventricular contraction)    Stroke [facility])     Past Surgical History:  Procedure Laterality Date   CARDIOVASCULAR STRESS  TEST     COLONOSCOPY  09/2015   CYSTECTOMY  2000   cyst on nerve on his spine   ESOPHAGOGASTRODUODENOSCOPY     HERNIA REPAIR  4097   umbilicial   holter mon     PENECTOMY  1995   TONSILLECTOMY       Current Outpatient Medications  Medication Sig Dispense Refill   acetaminophen (TYLENOL) 500 MG tablet Take 500 mg by mouth every 8 (eight) hours as needed for moderate pain.     allopurinol (ZYLOPRIM) 100 MG tablet Take 100 mg by mouth daily.     Alpha-Lipoic Acid 600 MG CAPS Take 1 capsule by mouth once.     carvedilol (COREG) 12.5 MG tablet Take 12.5 mg by mouth 2 (two) times daily with a meal.     Cholecalciferol (VITAMIN D3) 50 MCG (2000 UT) TABS Take by mouth once.     clopidogrel (PLAVIX) 75 MG tablet Take 1 tablet by mouth daily.     colchicine 0.6 MG tablet Take 0.6 mg by mouth daily.     dorzolamide-timolol (COSOPT) 2-0.5 % ophthalmic solution Place 1 drop into the right eye 2 (two) times daily.     Ferrous Sulfate (IRON) 325 (65 Fe) MG TABS Take by mouth once.     gabapentin (NEURONTIN) 600 MG tablet Take 1 tablet (600 mg total) by mouth 3 (three) times daily. 270 tablet 3   LORazepam (ATIVAN) 1 MG tablet Take 1 mg by mouth at bedtime as needed.     metFORMIN (GLUCOPHAGE) 1000 MG tablet Take 1,000 mg by mouth 2 (two) times daily with a meal.     Multiple Vitamins-Minerals (CENTRUM ADULTS) TABS Take 1 tablet by mouth once.     rosuvastatin (CRESTOR) 20 MG tablet Take 20 mg by mouth daily.     sildenafil (VIAGRA) 100 MG tablet Take 100 mg by mouth as needed.     valsartan-hydrochlorothiazide (DIOVAN-HCT) 160-12.5 MG tablet Take 1 tablet by mouth daily.     zolpidem (AMBIEN) 10 MG tablet Take 10 mg by mouth at bedtime as needed for sleep.     No current facility-administered medications for this visit.    Allergies:   Patient has no known allergies.    Social History:  The patient  reports that he has quit smoking. He has never used smokeless tobacco. He reports current  alcohol use of about 5.0 standard drinks of alcohol per week. He reports that he does not currently use drugs after having used the following drugs: Marijuana.   Family History:  The patient's family history includes Colon cancer in his mother; Diabetes in his mother; Hypertension in his mother; Stroke in his father.    ROS:  Please see the history of present illness.  Otherwise, review of systems are positive for back pain- improving.   All other systems are reviewed and negative.    PHYSICAL EXAM: VS:  BP (!) 128/50   Pulse 79   Ht '5\' 10"'$  (1.778 m)   Wt 180 lb (81.6 kg)   SpO2 99%   BMI 25.83 kg/m  , BMI Body mass index is 25.83 kg/m. GEN: Well nourished, well developed, in no acute distress HEENT: normal Neck: no JVD, carotid bruits, or masses Cardiac: RRR; no murmurs, rubs, or gallops,no edema  Respiratory:  clear to auscultation bilaterally, normal work of breathing GI: soft, nontender, nondistended, + BS MS: no deformity or atrophy Skin: warm and dry, no rash Neuro:  Strength and sensation are intact Psych: euthymic mood, full affect   EKG:   The ekg ordered today demonstrates NSR, no ST changes   Recent Labs: 07/01/2022: BUN 36; Creatinine, Ser 1.16; Hemoglobin 11.3; Platelets 151.0; Potassium 3.6; Sodium 138   Lipid Panel No results found for: "CHOL", "TRIG", "HDL", "CHOLHDL", "VLDL", "LDLCALC", "LDLDIRECT"   Other studies Reviewed: Additional studies/ records that were reviewed today with results demonstrating: labs reviewed.   ASSESSMENT AND PLAN:  Prior CVA: Checked for atrial fibrillation.  No AFib noted on prior w/u. No sx of palpitations.  No recurrent neuro events.  Diabetes: A1C 5.8.  Healthy diet. Hypertension: The current medical regimen is effective;  continue present plan and medications. Dilated aortic root: Mild AI. Recheck echo in 1 year.  Aorta size stable at 42 mm. If stable, may not need to image again.    Current medicines are reviewed at  length with the patient today.  The patient concerns regarding his medicines were addressed.  The following changes have been made:  No change  Labs/ tests ordered today include:  No orders of the defined types were placed in this encounter.   Recommend 150 minutes/week of aerobic exercise Low fat, low carb, high fiber diet recommended  Disposition:   FU in 1 year   Signed, Larae Grooms, MD  08/05/2022 4:29 PM    [facility] [place], [place], [place]  [address] Phone: [phone]; Fax: [phone]

## 2022-08-06 ENCOUNTER — Telehealth: Payer: Self-pay

## 2022-08-06 NOTE — Telephone Encounter (Signed)
Patient called into the office to follow up on [facility] colon appt. Pt states that his schedule is not set in stone because his wife receives cancer treatment in [place] every 2 week or so. Pt states that sometimes her appts change. Pt wanted to move his procedure to 10/22/22 at 10:15 am. Pt is aware that he will need to arrive by 8:45 am with a care partner. Pt wanted to know if we had any February dates, I told pt that we should be receiving the  February schedule in a few weeks. I told pt that I will let him know if Dr. Loletha Carrow receives a [facility] block or not. Pt is aware that he will need a PV appt as well, we will hold off on scheduling this until we confirm his procedure date. Pt verbalized understanding.

## 2022-08-07 NOTE — Addendum Note (Signed)
Addended by: Sharee Holster R on: 08/07/2022 07:54 AM   Modules accepted: Orders

## 2022-08-20 NOTE — Telephone Encounter (Signed)
February hospital schedule has been released. Called to offer patient an appt on Monday, 11/11/22 at [facility] with Dr. Loletha Carrow. Pt would like to keep as scheduled for 10/22/22 at 10:15 am. Pt is aware that he needs a PV appt but he is currently in the middle of something. Pt asked that I give him a call back before we leave for the day to schedule.

## 2022-08-21 NOTE — Telephone Encounter (Signed)
Called patient this morning to schedule PV appt. I left pt a detailed vm asking that he call back at his convenience to get scheduled for a PV 2 weeks prior to his colonoscopy appt on 10/22/22 at [facility].

## 2022-08-30 ENCOUNTER — Telehealth: Payer: Self-pay

## 2022-08-30 NOTE — Telephone Encounter (Signed)
Incoming call from patient returning your call. I scheduled him a previsit for a day that he's available 12/29

## 2022-08-30 NOTE — Telephone Encounter (Signed)
Noted, thanks!

## 2022-08-30 NOTE — Telephone Encounter (Signed)
-----   Message from Doran Stabler, MD sent at 08/30/2022  3:18 PM EST ----- Regarding: RE: Cardiac clearance Cardiovascular condition stable and last Cardiology office note 08/05/22 reviewed.  We can use the prior plavix clearance for the upcoming procedure.  Thanks for checking.  - HD ----- Message ----- From: Yevette Edwards, RN Sent: 08/30/2022   3:08 PM EST To: Doran Stabler, MD Subject: Cardiac clearance                              Dr. Loletha Carrow, This patient is scheduled for a Colon EMR at [facility] on 10/22/22. We obtained clearance to hold Plavix 5 days in 06/2022 for his last procedure. Do I need to send an updated request or are we OK to use the same clearance? Please advise. Thanks, [employer]

## 2022-09-20 ENCOUNTER — Ambulatory Visit (AMBULATORY_SURGERY_CENTER): Payer: Self-pay | Admitting: *Deleted

## 2022-09-20 VITALS — Ht 70.0 in | Wt 177.0 lb

## 2022-09-20 DIAGNOSIS — Z86010 Personal history of colonic polyps: Secondary | ICD-10-CM

## 2022-09-20 MED ORDER — NA SULFATE-K SULFATE-MG SULF 17.5-3.13-1.6 GM/177ML PO SOLN: 1.0000 | Freq: Once | ORAL | 0 refills | Status: AC

## 2022-09-20 NOTE — Progress Notes (Signed)
No egg or soy allergy known to patient  No issues known to pt with past sedation with any surgeries or procedures Patient denies ever being told they had issues or difficulty with intubation  No FH of Malignant Hyperthermia Pt is not on diet pills Pt is not on  home 02  Pt is not on blood thinners 5 days Pt denies issues with constipation  Pt is not on dialysis Pt denies any upcoming cardiac testing Pt encouraged to use to use Singlecare or Goodrx to reduce cost  Patient's chart reviewed by Osvaldo Angst CNRA prior to previsit and patient appropriate for the [place].  Previsit completed and red dot placed by patient's name on their procedure day (on provider's schedule).  . Pt to be done at [place] done by phone Instructions sent by mail and my chart

## 2022-10-15 ENCOUNTER — Encounter (HOSPITAL_COMMUNITY): Payer: Self-pay | Admitting: Gastroenterology

## 2022-10-15 ENCOUNTER — Encounter: Payer: Self-pay | Admitting: Gastroenterology

## 2022-10-17 ENCOUNTER — Telehealth: Payer: Self-pay

## 2022-10-17 NOTE — Telephone Encounter (Signed)
Left patient a detailed vm asking that he call back to confirm his colonoscopy appt at [facility] on 10/22/22 at 10:15 am. Pt should arrive at [facility] by 8:45 am with a care partner. I advised patient to call back to confirm that he has his prep and written instructions, especially for his blood thinner and diabetic medications.

## 2022-10-17 NOTE — Telephone Encounter (Signed)
Pt returned call, confirmed he had all his prep medications and knew what to do as far as his diabetic medications.

## 2022-10-17 NOTE — Telephone Encounter (Signed)
Noted, thanks!

## 2022-10-22 ENCOUNTER — Ambulatory Visit (HOSPITAL_COMMUNITY): Payer: Medicare Other | Admitting: Anesthesiology

## 2022-10-22 ENCOUNTER — Encounter (HOSPITAL_COMMUNITY)
Admission: RE | Disposition: A | Discharge status: Home / Self Care | Payer: Self-pay | Source: Home / Self Care | Attending: Gastroenterology

## 2022-10-22 ENCOUNTER — Ambulatory Visit (HOSPITAL_COMMUNITY)
Admission: RE | Admit: 2022-10-22 | Discharge: 2022-10-22 | Disposition: A | Discharge status: Home / Self Care | Payer: Medicare Other | Attending: Gastroenterology | Admitting: Gastroenterology

## 2022-10-22 ENCOUNTER — Encounter (HOSPITAL_COMMUNITY): Payer: Self-pay | Admitting: Gastroenterology

## 2022-10-22 ENCOUNTER — Ambulatory Visit (HOSPITAL_BASED_OUTPATIENT_CLINIC_OR_DEPARTMENT_OTHER): Payer: Medicare Other | Admitting: Anesthesiology

## 2022-10-22 DIAGNOSIS — Z7902 Long term (current) use of antithrombotics/antiplatelets: Secondary | ICD-10-CM | POA: Diagnosis not present

## 2022-10-22 DIAGNOSIS — D649 Anemia, unspecified: Secondary | ICD-10-CM | POA: Diagnosis not present

## 2022-10-22 DIAGNOSIS — Z8673 Personal history of transient ischemic attack (TIA), and cerebral infarction without residual deficits: Secondary | ICD-10-CM | POA: Diagnosis not present

## 2022-10-22 DIAGNOSIS — D126 Benign neoplasm of colon, unspecified: Secondary | ICD-10-CM | POA: Diagnosis present

## 2022-10-22 DIAGNOSIS — K5730 Diverticulosis of large intestine without perforation or abscess without bleeding: Secondary | ICD-10-CM | POA: Insufficient documentation

## 2022-10-22 DIAGNOSIS — Z87891 Personal history of nicotine dependence: Secondary | ICD-10-CM | POA: Diagnosis not present

## 2022-10-22 DIAGNOSIS — K649 Unspecified hemorrhoids: Secondary | ICD-10-CM | POA: Diagnosis not present

## 2022-10-22 DIAGNOSIS — K635 Polyp of colon: Secondary | ICD-10-CM

## 2022-10-22 DIAGNOSIS — E119 Type 2 diabetes mellitus without complications: Secondary | ICD-10-CM | POA: Diagnosis not present

## 2022-10-22 DIAGNOSIS — D123 Benign neoplasm of transverse colon: Secondary | ICD-10-CM

## 2022-10-22 DIAGNOSIS — Z7984 Long term (current) use of oral hypoglycemic drugs: Secondary | ICD-10-CM | POA: Insufficient documentation

## 2022-10-22 DIAGNOSIS — Q438 Other specified congenital malformations of intestine: Secondary | ICD-10-CM | POA: Insufficient documentation

## 2022-10-22 DIAGNOSIS — K5790 Diverticulosis of intestine, part unspecified, without perforation or abscess without bleeding: Secondary | ICD-10-CM | POA: Diagnosis not present

## 2022-10-22 DIAGNOSIS — I10 Essential (primary) hypertension: Secondary | ICD-10-CM | POA: Diagnosis not present

## 2022-10-22 HISTORY — PX: HEMOSTASIS CLIP PLACEMENT: SHX6857

## 2022-10-22 HISTORY — PX: COLONOSCOPY WITH PROPOFOL: SHX5780

## 2022-10-22 HISTORY — PX: SUBMUCOSAL LIFTING INJECTION: SHX6855

## 2022-10-22 HISTORY — PX: POLYPECTOMY: SHX5525

## 2022-10-22 HISTORY — PX: ENDOSCOPIC MUCOSAL RESECTION: SHX6839

## 2022-10-22 LAB — GLUCOSE, CAPILLARY: Glucose-Capillary: 109 mg/dL — ABNORMAL HIGH (ref 70–99)

## 2022-10-22 SURGERY — COLONOSCOPY WITH PROPOFOL
Anesthesia: Monitor Anesthesia Care

## 2022-10-22 MED ORDER — LACTATED RINGERS IV SOLN: INTRAVENOUS | Status: DC

## 2022-10-22 MED ORDER — PHENYLEPHRINE 80 MCG/ML (10ML) SYRINGE FOR IV PUSH (FOR BLOOD PRESSURE SUPPORT)
PREFILLED_SYRINGE | INTRAVENOUS | Status: DC | PRN
  Administered 2022-10-22 (×4): 80 ug via INTRAVENOUS

## 2022-10-22 MED ORDER — PROPOFOL 10 MG/ML IV BOLUS
INTRAVENOUS | Status: DC | PRN
  Administered 2022-10-22 (×2): 30 mg via INTRAVENOUS

## 2022-10-22 MED ORDER — PROPOFOL 500 MG/50ML IV EMUL
INTRAVENOUS | Status: DC | PRN
  Administered 2022-10-22: 150 ug/kg/min via INTRAVENOUS

## 2022-10-22 MED ORDER — SODIUM CHLORIDE 0.9 % IV SOLN: INTRAVENOUS | Status: DC

## 2022-10-22 SURGICAL SUPPLY — 22 items

## 2022-10-22 NOTE — Op Note (Signed)
[facility] Patient Name: Aaron Oliver Procedure Date: 10/22/2022 MRN: [id number] Attending MD: Estill Cotta. Loletha Carrow , MD, [id number] Date of Birth: 04-Oct-1940 CSN: [id number] Age: 82 Admit Type: Outpatient Procedure:                Colonoscopy Indications:              For therapy of adenomatous polyps in the colon                           multiple adenomatous polyps discovered on 07/26/22                            colonoscopy for anemia workup. Multiple polyps                            (including two larger) left with plans for removal                            today Providers:                Mallie Mussel L. Loletha Carrow, MD, Jobe Igo, RN, Cherylynn Ridges, Technician Referring MD:              Medicines:                Monitored Anesthesia Care Complications:            No immediate complications. Estimated Blood Loss:     Estimated blood loss was minimal. Procedure:                Pre-Anesthesia Assessment:                           - Prior to the procedure, a History and Physical                            was performed, and patient medications and                            allergies were reviewed. The patient's tolerance of                            previous anesthesia was also reviewed. The risks                            and benefits of the procedure and the sedation                            options and risks were discussed with the patient.                            All questions were answered, and informed consent                            was obtained.  Prior Anticoagulants: The patient has                            taken Plavix (clopidogrel), last dose was 5 days                            prior to procedure. ASA Grade Assessment: III - A                            patient with severe systemic disease. After                            reviewing the risks and benefits, the patient was                            deemed in satisfactory  condition to undergo the                            procedure.                           After obtaining informed consent, the colonoscope                            was passed under direct vision. Throughout the                            procedure, the patient's blood pressure, pulse, and                            oxygen saturations were monitored continuously. The                            CF-HQ190L ([id number] Olympus colonoscope was                            introduced through the anus and advanced to the the                            cecum, identified by appendiceal orifice and                            ileocecal valve. The colonoscopy was performed with                            difficulty. The patient tolerated the procedure                            well. The quality of the bowel preparation was                            generally good with lavage, with some residual  fluid and debris. The ileocecal valve, appendiceal                            orifice, and rectum were photographed. However,                            technical problems with Provation meant that the                            cecal and ICV photos were not saved (which was                            discovered at the time of report generation). Scope In: 10:19:12 AM Scope Out: 11:57:39 AM Scope Withdrawal Time: 1 hour 33 minutes 1 second  Total Procedure Duration: 1 hour 38 minutes 27 seconds  Findings:      The perianal and digital rectal examinations were normal.      A 15 mm polyp was found in the proximal transverse colon (just distal to       hepatic flexure and adjacent to the larger polyp noted below). The polyp       was semi-sessile. The polyp was removed with a piecemeal technique using       a cold snare. Resection and retrieval were complete. To prevent bleeding       post-intervention, two hemostatic clips were successfully placed. (Jar 1)      Three sessile polyps  were found in the transverse colon. The polyps were       4 to 6 mm in size. These polyps were removed with a cold snare.       Resection and retrieval were complete. (Jar 1)      Six sessile polyps were found in the transverse colon. The polyps were 5       to 8 mm in size. These polyps were removed with a cold snare. Resection       and retrieval were complete. (Jar 2)      Three sessile polyps were found in the transverse colon. The polyps were       4 to 6 mm in size. These polyps were removed with a cold snare.       Resection and retrieval were complete. (Jar 3)      An aproximately 35 mm polyp was found in the hepatic flexure. The polyp       was semi-sessile. Preparations were made for mucosal resection.       Demarcation of the lesion was performed with high-definition white light       and narrow band imaging to clearly identify the boundaries of the       lesion. EverLift was injected to raise the lesion (8cc) - lifted well.       Piecemeal mucosal resection using a snare was performed. This was       technically very challenging due to polyp morphology and location on a       flexure, redundant colon with looping and colon spasm. In addition, the       lifting agent slowly dissipated. After considerable effort,       visualization was no longer favorable for additional resection.       Resection was therefore incomplete, and the resected tissue was  partially retrieved. (Jar 4) Clips coupld not be placed on this       polypectomy site due to it's shape and position.      The sigmoid colon was redundant.      Multiple small-mouthed diverticula were found in the left colon.      The exam was otherwise without abnormality on direct and retroflexion       views. Impression:               - One 15 mm polyp in the proximal transverse colon,                            removed piecemeal using a cold snare. Resected and                            retrieved. Clips were placed.                            - Three 4 to 6 mm polyps in the transverse colon,                            removed with a cold snare. Resected and retrieved.                           - Six 5 to 8 mm polyps in the transverse colon,                            removed with a cold snare. Resected and retrieved.                           - Three 4 to 6 mm polyps in the transverse colon,                            removed with a cold snare. Resected and retrieved.                           - One 35 mm polyp at the hepatic flexure, removed                            with mucosal resection. Polyp resection was                            incomplete, and the resected tissue was partially                            retrieved.                           - Redundant colon.                           - Diverticulosis in the left colon.                           - The examination  was otherwise normal on direct                            and retroflexion views.                           - Mucosal resection was performed. Resection was                            incomplete, and the resected tissue was partially                            retrieved. Moderate Sedation:      MAC sedation used Recommendation:           - Patient has a contact number available for                            emergencies. The signs and symptoms of potential                            delayed complications were discussed with the                            patient. Return to normal activities tomorrow.                            Written discharge instructions were provided to the                            patient.                           - Resume previous diet.                           - Resume Plavix (clopidogrel) at prior dose in 5                            days.                           - Await pathology results.                           - Repeat colonoscopy in 6 months for surveillance                            after  piecemeal polypectomy. (with advanced                            endoscopist) Procedure Code(s):        --- Professional ---                           [phone], Colonoscopy, flexible; with endoscopic  mucosal resection                           45385, 59, Colonoscopy, flexible; with removal of                            tumor(s), polyp(s), or other lesion(s) by snare                            technique Diagnosis Code(s):        --- Professional ---                           D12.3, Benign neoplasm of transverse colon (hepatic                            flexure or splenic flexure)                           D12.6, Benign neoplasm of colon, unspecified                           K57.30, Diverticulosis of large intestine without                            perforation or abscess without bleeding                           Q43.8, Other specified congenital malformations of                            intestine CPT copyright 2022 American Medical Association. All rights reserved. The codes documented in this report are preliminary and upon coder review may  be revised to meet current compliance requirements. Melissa Tomaselli L. Loletha Carrow, MD 10/22/2022 12:16:45 PM This report has been signed electronically. Number of Addenda: 0

## 2022-10-22 NOTE — Anesthesia Preprocedure Evaluation (Signed)
Anesthesia Evaluation  Patient identified by MRN, date of birth, ID band Patient awake    Reviewed: Allergy & Precautions, NPO status , Patient's Chart, lab work & pertinent test results, reviewed documented beta blocker date and time   Airway Mallampati: II       Dental  (+) Poor Dentition, Teeth Intact   Pulmonary former smoker   Pulmonary exam normal        Cardiovascular hypertension, Pt. on home beta blockers Normal cardiovascular exam     Neuro/Psych  Headaches CVA, No Residual Symptoms  negative psych ROS   GI/Hepatic   Endo/Other  diabetes, Type 2, Oral Hypoglycemic Agents    Renal/GU   negative genitourinary   Musculoskeletal   Abdominal Normal abdominal exam  (+)   Peds  Hematology   Anesthesia Other Findings   Reproductive/Obstetrics                             Anesthesia Physical Anesthesia Plan  ASA: 2  Anesthesia Plan: MAC   Post-op Pain Management:    Induction:   PONV Risk Score and Plan: 1 and Propofol infusion, TIVA and Treatment may vary due to age or medical condition  Airway Management Planned: Natural Airway, Simple Face Mask and Nasal Cannula  Additional Equipment: None  Intra-op Plan:   Post-operative Plan:   Informed Consent: I have reviewed the patients History and Physical, chart, labs and discussed the procedure including the risks, benefits and alternatives for the proposed anesthesia with the patient or authorized representative who has indicated his/her understanding and acceptance.       Plan Discussed with: CRNA  Anesthesia Plan Comments:        Anesthesia Quick Evaluation

## 2022-10-22 NOTE — Transfer of Care (Signed)
Immediate Anesthesia Transfer of Care Note  Patient: Aaron Oliver  Procedure(s) Performed: COLONOSCOPY WITH PROPOFOL ENDOSCOPIC MUCOSAL RESECTION POLYPECTOMY HEMOSTASIS CLIP PLACEMENT SUBMUCOSAL LIFTING INJECTION  Patient Location: Endoscopy Unit  Anesthesia Type:MAC  Level of Consciousness: awake and alert   Airway & Oxygen Therapy: Patient Spontanous Breathing and Patient connected to face mask oxygen  Post-op Assessment: Report given to RN and Post -op Vital signs reviewed and stable  Post vital signs: Reviewed and stable  Last Vitals:  Vitals Value Taken Time  BP    Temp    Pulse    Resp    SpO2      Last Pain:  Vitals:   10/22/22 1011  TempSrc:   PainSc: 0-No pain         Complications: No notable events documented.

## 2022-10-22 NOTE — H&P (Signed)
History and Physical:  This patient presents for endoscopic testing for:  Endoscopic therapy of colon polyps  82 year old man here for colonoscopy to remove multiple colon polyps (at least 1 by EMR), polyps which were discovered on diagnostic colonoscopy in late 2023. He has had no clinical changes to his health since then.  Plavix has been held 5 days before this procedure.  Patient is otherwise without complaints or active issues today.   Past Medical History: Past Medical History:  Diagnosis Date   Anemia    Chronic back pain    Colon polyp    Diabetes ([place])    Elevated PSA    Glaucoma    Gout    Heel spur    Hyperlipemia    Hypertension    Insomnia    Kidney stone    Neuropathy    PVC (premature ventricular contraction)    Stroke [facility])      Past Surgical History: Past Surgical History:  Procedure Laterality Date   CARDIOVASCULAR STRESS TEST     COLONOSCOPY  09/2015   CYSTECTOMY  2000   cyst on nerve on his spine   ESOPHAGOGASTRODUODENOSCOPY     HERNIA REPAIR  8588   umbilicial   holter mon     PENECTOMY  1995   TONSILLECTOMY      Allergies: No Known Allergies  Outpatient Meds: Current Facility-Administered Medications  Medication Dose Route Frequency Provider Last Rate Last Admin   0.9 %  sodium chloride infusion   Intravenous Continuous Danis, Estill Cotta III, MD       lactated ringers infusion   Intravenous Continuous Nelida Meuse III, MD 50 mL/hr at 10/22/22 1001 Continued from Pre-op at 10/22/22 1001      ___________________________________________________________________ Objective   Exam:  BP (!) 188/64   Pulse 69   Temp (!) 97.2 F (36.2 C) (Temporal)   Resp 16   Ht '5\' 10"'$  (1.778 m)   Wt 79.4 kg   SpO2 100%   BMI 25.11 kg/m   CV: regular , S1/S2 Resp: clear to auscultation bilaterally, normal RR and effort noted GI: soft, no tenderness, with active bowel sounds.   Assessment: Adenomatous colon  polyps   Plan: Colonoscopy  The benefits and risks of the planned procedure were described in detail with the patient or (when appropriate) their health care proxy.  Risks were outlined as including, but not limited to, bleeding, infection, perforation, adverse medication reaction leading to cardiac or pulmonary decompensation, pancreatitis (if ERCP).  The limitation of incomplete mucosal visualization was also discussed.  No guarantees or warranties were given.    The patient is appropriate for an endoscopic procedure in the ambulatory setting.   - Wilfrid Lund, MD

## 2022-10-22 NOTE — Discharge Instructions (Addendum)
YOU HAD AN ENDOSCOPIC PROCEDURE TODAY: Refer to the procedure report and other information in the discharge instructions given to you for any specific questions about what was found during the examination. If this information does not answer your questions, please call [place] office at [phone] to clarify.   YOU SHOULD EXPECT: Some feelings of bloating in the abdomen. Passage of more gas than usual. Walking can help get rid of the air that was put into your GI tract during the procedure and reduce the bloating. If you had a lower endoscopy (such as a colonoscopy or flexible sigmoidoscopy) you may notice spotting of blood in your stool or on the toilet paper. Some abdominal soreness may be present for a day or two, also.  DIET: Your first meal following the procedure should be a light meal and then it is ok to progress to your normal diet. A half-sandwich or bowl of soup is an example of a good first meal. Heavy or fried foods are harder to digest and may make you feel nauseous or bloated. Drink plenty of fluids but you should avoid alcoholic beverages for 24 hours. If you had a esophageal dilation, please see attached instructions for diet.    ACTIVITY: Your care partner should take you home directly after the procedure. You should plan to take it easy, moving slowly for the rest of the day. You can resume normal activity the day after the procedure however YOU SHOULD NOT DRIVE, use power tools, machinery or perform tasks that involve climbing or major physical exertion for 24 hours (because of the sedation medicines used during the test).   SYMPTOMS TO REPORT IMMEDIATELY: A gastroenterologist can be reached at any hour. Please call [phone]  for any of the following symptoms:  Following lower endoscopy (colonoscopy, flexible sigmoidoscopy) Excessive amounts of blood in the stool  Significant tenderness, worsening of abdominal pains  Swelling of the abdomen that is new, acute  Fever of 100 or  higher  Following upper endoscopy (EGD, EUS, ERCP, esophageal dilation) Vomiting of blood or coffee ground material  New, significant abdominal pain  New, significant chest pain or pain under the shoulder blades  Painful or persistently difficult swallowing  New shortness of breath  Black, tarry-looking or red, bloody stools  FOLLOW UP:  If any biopsies were taken you will be contacted by phone or by letter within the next 1-3 weeks. Call [phone]  if you have not heard about the biopsies in 3 weeks.  Please also call with any specific questions about appointments or follow up tests.  _____________________________________   Resume your plavix (clopidogrel) at usual dose on 10/27/22

## 2022-10-22 NOTE — Anesthesia Procedure Notes (Signed)
Procedure Name: MAC Date/Time: 10/22/2022 10:23 AM  Performed by: Lieutenant Diego, CRNAPre-anesthesia Checklist: Patient identified, Emergency Drugs available, Suction available, Patient being monitored and Timeout performed Patient Re-evaluated:Patient Re-evaluated prior to induction Oxygen Delivery Method: Simple face mask Preoxygenation: Pre-oxygenation with 100% oxygen Induction Type: IV induction

## 2022-10-22 NOTE — Interval H&P Note (Signed)
History and Physical Interval Note:  10/22/2022 10:09 AM  Aaron Oliver  has presented today for surgery, with the diagnosis of EMR of transvere colon polyp.  The various methods of treatment have been discussed with the patient and family. After consideration of risks, benefits and other options for treatment, the patient has consented to  Procedure(s): COLONOSCOPY WITH PROPOFOL (N/A) ENDOSCOPIC MUCOSAL RESECTION (N/A) as a surgical intervention.  The patient's history has been reviewed, patient examined, no change in status, stable for surgery.  I have reviewed the patient's chart and labs.  Questions were answered to the patient's satisfaction.     Nelida Meuse III

## 2022-10-24 LAB — SURGICAL PATHOLOGY

## 2022-10-25 ENCOUNTER — Encounter: Payer: Self-pay | Admitting: Gastroenterology

## 2022-10-25 NOTE — Anesthesia Postprocedure Evaluation (Signed)
Anesthesia Post Note  Patient: Aaron Oliver  Procedure(s) Performed: COLONOSCOPY WITH PROPOFOL ENDOSCOPIC MUCOSAL RESECTION POLYPECTOMY HEMOSTASIS CLIP PLACEMENT SUBMUCOSAL LIFTING INJECTION     Patient location during evaluation: Endoscopy Anesthesia Type: MAC Level of consciousness: awake Pain management: pain level controlled Vital Signs Assessment: post-procedure vital signs reviewed and stable Respiratory status: spontaneous breathing Cardiovascular status: stable Postop Assessment: no apparent nausea or vomiting Anesthetic complications: no  No notable events documented.  Last Vitals:  Vitals:   10/22/22 1215 10/22/22 1225  BP: (!) 117/48 (!) 156/49  Pulse: 69 65  Resp: 15 19  Temp:    SpO2: 100% 100%    Last Pain:  Vitals:   10/22/22 1225  TempSrc:   PainSc: 0-No pain   Pain Goal:                   Huston Foley

## 2023-02-19 ENCOUNTER — Encounter: Payer: Self-pay | Admitting: Gastroenterology

## 2023-05-02 ENCOUNTER — Telehealth: Payer: Self-pay | Admitting: Gastroenterology

## 2023-05-02 NOTE — Telephone Encounter (Signed)
Inbound call from patient stating that he received a letter in the mail about scheduling an appointment to discuss colonoscopy. I advised patient that Dr. Myrtie Neither was scheduling in November. Patient requested to speak with nurse because Dr. Myrtie Neither wanted to see him in August. Please advise.

## 2023-05-02 NOTE — Telephone Encounter (Signed)
Dr. Myrtie Neither, please advise on scheduling. Your next available appt is not until 07/25/23.

## 2023-05-06 NOTE — Telephone Encounter (Signed)
Reviewing my last procedure report, it was actually my intention that he have his next colonoscopy with Dr. Meridee Score due to the technical challenges of polypectomy and his colonic anatomy.  My apologies if that was not made clear when I did the recall sheet. I understand that his schedule is challenging because he and his wife travel to [place] regularly for her cancer treatments.  I have copied this to Dr. Meridee Score for chart review.  I expect he will respond soon, and most likely will want to set a date but also see the patient in the office beforehand.    -HD

## 2023-05-07 NOTE — Telephone Encounter (Signed)
Happy to be of assistance for this patient. Colonoscopy with EMR 90-minute slot (Endorotor available) should be set up by the end of this year based on my availability. Patient should be seen in clinic to discuss advanced resection attempt/techniques. If he wants to wait until after the clinic visit to be scheduled, that is fine to just push out the date. Thanks. GM

## 2023-05-07 NOTE — Telephone Encounter (Signed)
Left message on machine to call back  

## 2023-05-08 NOTE — Telephone Encounter (Signed)
Left message on machine to call back  

## 2023-05-09 ENCOUNTER — Other Ambulatory Visit: Payer: Self-pay

## 2023-05-09 DIAGNOSIS — Z860100 Personal history of colon polyps, unspecified: Secondary | ICD-10-CM

## 2023-05-09 DIAGNOSIS — K6389 Other specified diseases of intestine: Secondary | ICD-10-CM

## 2023-05-09 MED ORDER — NA SULFATE-K SULFATE-MG SULF 17.5-3.13-1.6 GM/177ML PO SOLN: 1.0000 | Freq: Once | ORAL | 0 refills | Status: AC

## 2023-05-09 NOTE — Telephone Encounter (Signed)
Office visit has been scheduled for 08/08/23 at 8:50 am with GM   Left message on machine to call back

## 2023-05-09 NOTE — Telephone Encounter (Signed)
Colon EMR has been set up for 08/25/23 at 8 am with GM at [facility]

## 2023-05-12 NOTE — Telephone Encounter (Signed)
I spoke with the pt and discussed the colon and follow up appt to discuss with GM.  He did ask to change the appt for the colon to another day due to his wife's cancer treatments.  All other questions answered to the best of my ability- he will call back with any further concerns

## 2023-07-24 ENCOUNTER — Telehealth: Payer: Self-pay | Admitting: Internal Medicine

## 2023-07-24 NOTE — Telephone Encounter (Signed)
Spoke with patient and he states the past two nights when he was getting ready for bed he feels like he could hear his heart working.  He did not take his vitals last night or the night before. He denies any chest pain, SOB, headache, nausea or vomiting. Today BP 146/70 HR 76. Advised to monitor his bp and hr when lying down. ED precautions discussed. Will forward to provider.

## 2023-07-24 NOTE — Telephone Encounter (Signed)
Patient c/o Palpitations:  High priority if patient c/o lightheadedness, shortness of breath, or chest pain  How long have you had palpitations/irregular HR/ Afib? Are you having the symptoms now?  Palpitations where he can hear his heart beat occurring for the past 2 nights - no symptoms currently  Are you currently experiencing lightheadedness, SOB or CP?  No   Do you have a history of afib (atrial fibrillation) or irregular heart rhythm?  No   Have you checked your BP or HR? (document readings if available):  HR is good, in the 70's, but patient states he has not checked his BP  Are you experiencing any other symptoms?  No

## 2023-07-24 NOTE — Telephone Encounter (Signed)
Appointment with Dr. Lynnette Caffey was made this morning when patient called.  He is scheduled for 08/06/23.

## 2023-07-30 ENCOUNTER — Encounter: Payer: Self-pay | Admitting: Internal Medicine

## 2023-07-30 ENCOUNTER — Ambulatory Visit: Payer: Medicare Other | Attending: Internal Medicine | Admitting: Internal Medicine

## 2023-07-30 VITALS — BP 120/70 | HR 75 | Resp 16 | Ht 70.0 in | Wt 184.2 lb

## 2023-07-30 DIAGNOSIS — I152 Hypertension secondary to endocrine disorders: Secondary | ICD-10-CM | POA: Diagnosis not present

## 2023-07-30 DIAGNOSIS — I639 Cerebral infarction, unspecified: Secondary | ICD-10-CM

## 2023-07-30 DIAGNOSIS — E1159 Type 2 diabetes mellitus with other circulatory complications: Secondary | ICD-10-CM

## 2023-07-30 DIAGNOSIS — E1169 Type 2 diabetes mellitus with other specified complication: Secondary | ICD-10-CM

## 2023-07-30 DIAGNOSIS — E785 Hyperlipidemia, unspecified: Secondary | ICD-10-CM

## 2023-07-30 DIAGNOSIS — N1831 Chronic kidney disease, stage 3a: Secondary | ICD-10-CM | POA: Diagnosis not present

## 2023-07-30 DIAGNOSIS — I77819 Aortic ectasia, unspecified site: Secondary | ICD-10-CM

## 2023-07-30 DIAGNOSIS — Z6826 Body mass index (BMI) 26.0-26.9, adult: Secondary | ICD-10-CM

## 2023-07-30 MED ORDER — EMPAGLIFLOZIN 10 MG PO TABS: 10.0000 mg | ORAL_TABLET | Freq: Every day | ORAL | 5 refills | Status: DC

## 2023-07-30 MED ORDER — VALSARTAN-HYDROCHLOROTHIAZIDE 320-25 MG PO TABS: 1.0000 | ORAL_TABLET | Freq: Every day | ORAL | 3 refills | Status: AC

## 2023-07-30 MED ORDER — EMPAGLIFLOZIN 10 MG PO TABS: 10.0000 mg | ORAL_TABLET | Freq: Every day | ORAL | Status: AC

## 2023-07-30 NOTE — Patient Instructions (Addendum)
Medication Instructions:  Your physician has recommended you make the following change in your medication:  1.) start Jardiance 10 mg daily 2.) increase Diovan to 360-25 mg --take one tablet daily  *If you need a refill on your cardiac medications before your next appointment, please call your pharmacy*   Lab Work: Return in about 10 days for lab work (BMET, lipids, liver)   Testing/Procedures: CT Chest Angiogram -Aorta -- (Will need blood work within 30 days prior)   Follow-Up: At [facility], you and your health needs are our priority.  As part of our continuing mission to provide you with exceptional heart care, we have created designated Provider Care Teams.  These Care Teams include your primary Cardiologist (physician) and Advanced Practice Providers (APPs -  Physician Assistants and Nurse Practitioners) who all work together to provide you with the care you need, when you need it.   Your next appointment:   6 month(s)  Provider:   Alverda Skeans, MD

## 2023-07-30 NOTE — Progress Notes (Signed)
Cardiology Office Note:   Date:  07/30/2023  ID:  Aaron Oliver, DOB 07-27-1941, MRN [id number] PCP:  Alysia Penna, MD  [facility] Providers Cardiologist:  Alverda Skeans, MD Referring MD: Alysia Penna, MD  Chief Complaint/Reason for Referral: Cardiology follow-up ASSESSMENT:    1. Cerebrovascular accident (CVA), unspecified mechanism (HCC)   2. Type 2 diabetes mellitus with other circulatory complication, without long-term current use of insulin (HCC)   3. Hypertension associated with diabetes (HCC)   4. Hyperlipidemia associated with type 2 diabetes mellitus (HCC)   5. Stage 3a chronic kidney disease (HCC)   6. BMI 26.0-26.9,adult   7. Aortic dilatation (HCC)     PLAN:   In order of problems listed above: History of stroke: Continue Plavix, rosuvastatin, and control cardiovascular risk factors. Type 2 diabetes mellitus: Continue Plavix, rosuvastatin, Diovan HCT.  Start Jardiance 10mg  daily. Hypertension:  BP is well controlled today however his BPs at home were quite elevated.  Will increase Diovan HCT to 320/25 mg, check BMP next week.  The patient will contact us with blood pressure readings over the next few weeks.  If needed we can add amlodipine.  He does have complaints of pulsatile tinnitus.  He will be seeing ENT.  If improved blood pressure control does not resolve these symptoms, we can consider carotid ultrasound.  I did not appreciate any bruits on exam. Hyperlipidemia: Will check lipid panel, LFTs next week.  Goal LDL is less than 55 given history of stroke.  Will defer LP(a) testing given advanced age. CKD stage III: Continue Diovan.  Start Jardiance 10 mg daily. Elevated BMI: Given advanced age we will defer referral for GLP-1 receptor agonist therapy.  Continue diet and exercise modification. Aortic dilatation: Will obtain one-time CTA to evaluate fully next year.            Dispo:  Return in about 6 months (around 01/27/2024).      Medication  Adjustments/Labs and Tests Ordered: Current medicines are reviewed at length with the patient today.  Concerns regarding medicines are outlined above.  The following changes have been made:    Labs/tests ordered: Orders Placed This Encounter  Procedures   CT ANGIO CHEST AORTA W/CM & OR WO/CM   Basic Metabolic Panel (BMET)   Basic Metabolic Panel (BMET)   Lipid Profile   Hepatic function panel   EKG 12-Lead    Medication Changes: Meds ordered this encounter  Medications   empagliflozin (JARDIANCE) 10 MG TABS tablet    Sig: Take 1 tablet (10 mg total) by mouth daily before breakfast.    Dispense:  30 tablet    Refill:  5   valsartan-hydrochlorothiazide (DIOVAN HCT) 320-25 MG tablet    Sig: Take 1 tablet by mouth daily.    Dispense:  90 tablet    Refill:  3    Dose increase   empagliflozin (JARDIANCE) 10 MG TABS tablet    Sig: Take 1 tablet (10 mg total) by mouth daily before breakfast.    Order Specific Question:   Lot Number?    Answer:   [id number]    Order Specific Question:   Expiration Date?    Answer:   07/23/2025    Current medicines are reviewed at length with the patient today.  The patient does not have concerns regarding medicines.  I spent 33 minutes reviewing all clinical data during and prior to this visit including all relevant imaging studies, laboratories, clinical information from other health systems, and prior notes from  both Cardiology and other specialties, interviewing the patient, and conducting a complete physical examination in order to formulate a comprehensive and personalized evaluation and treatment plan.  History of Present Illness:      FOCUSED PROBLEM LIST:   CVA 2022 MRI scattered acute to subacute bilateral cerebral infarcts MRA no carotid or aortic arch atherosclerosis 30% right vertebral artery lesion Monitor 2022 no atrial fibrillation Aortic dilatation 42 mm TTE 2023 Type 2 diabetes on metformin Hypertension Hyperlipidemia CKD  stage III BMI 19 August 2023: The patient is being seen for routine cardiology follow-up.  He was first seen 2022 after a stroke for concerns due to atrial fibrillation.  He was referred for echocardiogram which was reassuring.  Monitor demonstrated no atrial fibrillation.  He was seen in 2023 and was doing well without cardiovascular complaints.  Echocardiogram at that time demonstrated preserved LV function with mild aortic dilatation.  The patient is under a great deal of caregiver stress.  His wife has an incurable cancer and is in a research trial.  He has to travel to [place] several times a year.  She also has other orthopedic issues that he needs to attend to.  Recently he has noticed his blood pressure has been elevated.  He brought his blood pressure log in with him which show pretty consistent blood pressures above 130 systolic.  He was seen by his primary care provider's office and amlodipine as needed was started.  The patient has taken this occasionally.  He however denies any chest pain or shortness of breath.  He has noticed when he lays down on his left side he will notice pulsatile tinnitus.  This concerned him for high blood pressure issues.  He will be seeing ENT due to this issue as well.          Current Medications: Current Meds  Medication Sig   allopurinol (ZYLOPRIM) 100 MG tablet Take 100 mg by mouth in the morning.   Alpha-Lipoic Acid 600 MG CAPS Take 600 mg by mouth in the morning.   Blood Glucose Monitoring Suppl (ONE TOUCH ULTRA 2) w/Device KIT Use to test blood sugars in vitro once a day   carvedilol (COREG) 12.5 MG tablet Take 12.5 mg by mouth in the morning and at bedtime.   Cholecalciferol (VITAMIN D3) 50 MCG (2000 UT) TABS Take 2,000 Units by mouth in the morning.   clopidogrel (PLAVIX) 75 MG tablet Take 75 mg by mouth in the morning.   colchicine 0.6 MG tablet Take 0.6 mg by mouth in the morning.   dorzolamide-timolol (COSOPT) 2-0.5 % ophthalmic solution  Place 1 drop into the right eye 2 (two) times daily.   empagliflozin (JARDIANCE) 10 MG TABS tablet Take 1 tablet (10 mg total) by mouth daily before breakfast.   empagliflozin (JARDIANCE) 10 MG TABS tablet Take 1 tablet (10 mg total) by mouth daily before breakfast.   Ferrous Sulfate (IRON) 325 (65 Fe) MG TABS Take 325 mg by mouth in the morning.   gabapentin (NEURONTIN) 100 MG capsule Take 100 mg by mouth in the morning and at bedtime. Takes with the 600 mg tablet twice daily   gabapentin (NEURONTIN) 600 MG tablet Take 1 tablet (600 mg total) by mouth 3 (three) times daily. (Patient taking differently: Take 600 mg by mouth in the morning and at bedtime. Takes with the 100 mg capsule twice daily)   LORazepam (ATIVAN) 1 MG tablet Take 1 mg by mouth 2 (two) times daily as needed for  anxiety.   metFORMIN (GLUCOPHAGE) 1000 MG tablet Take 1,000 mg by mouth in the morning and at bedtime.   Multiple Vitamin (MULTIVITAMIN WITH MINERALS) TABS tablet Take 1 tablet by mouth in the morning.   rosuvastatin (CRESTOR) 20 MG tablet Take 20 mg by mouth every evening.   sildenafil (VIAGRA) 100 MG tablet Take 100 mg by mouth daily as needed for erectile dysfunction.   valsartan-hydrochlorothiazide (DIOVAN HCT) 320-25 MG tablet Take 1 tablet by mouth daily.   zolpidem (AMBIEN) 10 MG tablet Take 10 mg by mouth at bedtime.   [DISCONTINUED] valsartan-hydrochlorothiazide (DIOVAN-HCT) 160-12.5 MG tablet Take 1 tablet by mouth in the morning.     Review of Systems:   Please see the history of present illness.    All other systems reviewed and are negative.     EKGs/Labs/Other Test Reviewed:   EKG: EKG performed November 2023 that I personally reviewed demonstrates normal sinus rhythm  EKG Interpretation Date/Time:  Wednesday July 30 2023 14:00:35 EST Ventricular Rate:  74 PR Interval:  180 QRS Duration:  88 QT Interval:  380 QTC Calculation: 421 R Axis:   -7  Text Interpretation: Normal sinus rhythm  Normal ECG No previous ECGs available Confirmed by Alverda Skeans ([id number]) on 07/30/2023 2:09:27 PM         Risk Assessment/Calculations:          Physical Exam:   VS:  BP 120/70 (BP Location: Left Arm, Patient Position: Sitting, Cuff Size: Normal)   Pulse 75   Resp 16   Ht 5\' 10"  (1.778 m)   Wt 184 lb 3.2 oz (83.6 kg)   SpO2 94%   BMI 26.43 kg/m        Wt Readings from Last 3 Encounters:  07/30/23 184 lb 3.2 oz (83.6 kg)  10/22/22 175 lb (79.4 kg)  09/20/22 177 lb (80.3 kg)      GENERAL:  No apparent distress, AOx3 HEENT:  No carotid bruits, +2 carotid impulses, no scleral icterus CAR: RRR no murmurs, gallops, rubs, or thrills RES:  Clear to auscultation bilaterally ABD:  Soft, nontender, nondistended, positive bowel sounds x 4 VASC:  +2 radial pulses, +2 carotid pulses NEURO:  CN 2-12 grossly intact; motor and sensory grossly intact PSYCH:  No active depression or anxiety EXT:  No edema, ecchymosis, or cyanosis  Signed, Orbie Pyo, MD  07/30/2023 3:21 PM    [facility] [address] [place], [place], [place]  [address] Phone: [phone]; Fax: [phone]   Note:  This document was prepared using Dragon voice recognition software and may include unintentional dictation errors.

## 2023-07-31 ENCOUNTER — Other Ambulatory Visit: Payer: Self-pay | Admitting: *Deleted

## 2023-07-31 DIAGNOSIS — E1169 Type 2 diabetes mellitus with other specified complication: Secondary | ICD-10-CM

## 2023-08-06 ENCOUNTER — Ambulatory Visit: Payer: Medicare Other | Admitting: Internal Medicine

## 2023-08-08 ENCOUNTER — Encounter: Payer: Self-pay | Admitting: Gastroenterology

## 2023-08-08 ENCOUNTER — Other Ambulatory Visit (INDEPENDENT_AMBULATORY_CARE_PROVIDER_SITE_OTHER): Payer: Medicare Other

## 2023-08-08 ENCOUNTER — Ambulatory Visit: Payer: Medicare Other | Admitting: Gastroenterology

## 2023-08-08 ENCOUNTER — Telehealth: Payer: Self-pay

## 2023-08-08 VITALS — BP 150/60 | HR 76 | Ht 69.0 in | Wt 182.5 lb

## 2023-08-08 DIAGNOSIS — K6389 Other specified diseases of intestine: Secondary | ICD-10-CM

## 2023-08-08 DIAGNOSIS — R195 Other fecal abnormalities: Secondary | ICD-10-CM | POA: Diagnosis not present

## 2023-08-08 DIAGNOSIS — D123 Benign neoplasm of transverse colon: Secondary | ICD-10-CM

## 2023-08-08 DIAGNOSIS — D122 Benign neoplasm of ascending colon: Secondary | ICD-10-CM

## 2023-08-08 DIAGNOSIS — Z860101 Personal history of adenomatous and serrated colon polyps: Secondary | ICD-10-CM | POA: Diagnosis not present

## 2023-08-08 DIAGNOSIS — Z860100 Personal history of colon polyps, unspecified: Secondary | ICD-10-CM

## 2023-08-08 DIAGNOSIS — D649 Anemia, unspecified: Secondary | ICD-10-CM

## 2023-08-08 DIAGNOSIS — Z7902 Long term (current) use of antithrombotics/antiplatelets: Secondary | ICD-10-CM

## 2023-08-08 LAB — BASIC METABOLIC PANEL
BUN: 32 mg/dL — ABNORMAL HIGH (ref 6–23)
CO2: 32 meq/L (ref 19–32)
Calcium: 9.8 mg/dL (ref 8.4–10.5)
Chloride: 99 meq/L (ref 96–112)
Creatinine, Ser: 1.3 mg/dL (ref 0.40–1.50)
GFR: 51.3 mL/min — ABNORMAL LOW (ref >60.00)
Glucose, Bld: 118 mg/dL — ABNORMAL HIGH (ref 70–99)
Potassium: 3.5 meq/L (ref 3.5–5.1)
Sodium: 141 meq/L (ref 135–145)

## 2023-08-08 LAB — CBC
HCT: 32.6 % — ABNORMAL LOW (ref 39.0–52.0)
Hemoglobin: 11.1 g/dL — ABNORMAL LOW (ref 13.0–17.0)
MCHC: 34 g/dL (ref 30.0–36.0)
MCV: 86.6 fL (ref 78.0–100.0)
Platelets: 161 10*3/uL (ref 150.0–400.0)
RBC: 3.77 Mil/uL — ABNORMAL LOW (ref 4.22–5.81)
RDW: 14.2 % (ref 11.5–15.5)
WBC: 5.3 10*3/uL (ref 4.0–10.5)

## 2023-08-08 NOTE — Telephone Encounter (Signed)
Patty, I was not sure if clearance had been sent. I will send letter to PCP to asking for clearance to hold Plavix.

## 2023-08-08 NOTE — Progress Notes (Unsigned)
GASTROENTEROLOGY OUTPATIENT CLINIC VISIT   Primary Care Provider Alysia Penna, MD [address] [place] [address] [phone]  Referring Provider Alysia Penna, MD [address] [place],  [place] [address] [phone]  Patient Profile: Aaron Oliver is a 82 y.o. 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  The patient presents to the [facility] for an evaluation and management of problem(s) noted below:  Problem List 1. Adenomatous polyp of hepatic flexure   2. Hx of adenomatous colonic polyps   3. Loose bowel movements   4. Antiplatelet or antithrombotic long-term use    Discussed the use of AI scribe software for clinical note transcription with the patient, who gave verbal consent to proceed.  History of Present Illness Please see prior GI notes for full details of HPI.  Interval History This is a patient of Dr. Myrtie Neither, whom I am seeing with a history of multiple precancerous polyps for which an ascending colon polyp was incompletely resected and for which advanced resection has been requested to try and prevent surgery.  Full colonoscopy report is documented below.  The patient expresses concern about the increased risk of perforation and other complications with another attempt at resection due to their age.   Also, he has some reservations about the frequency of colonoscopies at his age.  The patient has had looser stools for a while and to regulate this, he takes Metamucil daily, which does help (but if he misses Metamucil, he may have multiple loose BMs).  He has not had any blood in his stools.  The patient also reported a long-standing issue with anemia, which has been present for over 20 years of an unclear etiology (last blood count was in 2023).  He has seen a hematologist >10  years ago and never had a bone marrow biopsy.   GI Review of Systems Positive as above Negative for dysphagia, odynophagia, abdominal pain, melena, hematochezia  Review of Systems General: Denies fevers/chills/weight loss unintentionally Cardiovascular: Denies chest pain Pulmonary: Denies shortness of breath Gastroenterological: See HPI Genitourinary: Denies darkened urine Hematological: Denies easy bruising/bleeding Endocrine: Denies temperature intolerance Dermatological: Denies jaundice Psychological: Mood is stable   Medications Current Outpatient Medications  Medication Sig Dispense Refill   acetaminophen (TYLENOL) 500 MG tablet Take 500 mg by mouth every 8 (eight) hours as needed for moderate pain (pain score 4-6).     allopurinol (ZYLOPRIM) 100 MG tablet Take 100 mg by mouth in the morning.     Alpha-Lipoic Acid 600 MG CAPS Take 600 mg by mouth in the morning.     Blood Glucose Monitoring Suppl (ONE TOUCH ULTRA 2) w/Device KIT Use to test blood sugars in vitro once a day     carvedilol (COREG) 12.5 MG tablet Take 12.5 mg by mouth in the morning and at bedtime.     Cholecalciferol (VITAMIN D3) 50 MCG (2000 UT) TABS Take 2,000 Units by mouth in the morning.     clopidogrel (PLAVIX) 75 MG tablet Take 75 mg by mouth in the morning.     colchicine 0.6 MG tablet Take 0.6 mg by mouth in the morning.     dorzolamide-timolol (COSOPT) 2-0.5 % ophthalmic solution Place 1 drop into the right eye 2 (two) times daily.     empagliflozin (JARDIANCE) 10 MG TABS tablet Take 1 tablet (10 mg total) by mouth daily before breakfast.  30 tablet 5   empagliflozin (JARDIANCE) 10 MG TABS tablet Take 1 tablet (10 mg total) by mouth daily before breakfast.     Ferrous Sulfate (IRON) 325 (65 Fe) MG TABS Take 325 mg by mouth in the morning.     gabapentin (NEURONTIN) 100 MG capsule Take 100 mg by mouth in the morning and at bedtime. Takes with the 600 mg tablet twice daily     gabapentin (NEURONTIN) 600 MG  tablet Take 1 tablet (600 mg total) by mouth 3 (three) times daily. (Patient taking differently: Take 600 mg by mouth in the morning and at bedtime. Takes with the 100 mg capsule twice daily) 270 tablet 3   LORazepam (ATIVAN) 1 MG tablet Take 1 mg by mouth 2 (two) times daily as needed for anxiety.     metFORMIN (GLUCOPHAGE) 1000 MG tablet Take 1,000 mg by mouth in the morning and at bedtime.     Multiple Vitamin (MULTIVITAMIN WITH MINERALS) TABS tablet Take 1 tablet by mouth in the morning.     rosuvastatin (CRESTOR) 20 MG tablet Take 20 mg by mouth every evening.     sildenafil (VIAGRA) 100 MG tablet Take 100 mg by mouth daily as needed for erectile dysfunction.     valsartan-hydrochlorothiazide (DIOVAN HCT) 320-25 MG tablet Take 1 tablet by mouth daily. 90 tablet 3   zolpidem (AMBIEN) 10 MG tablet Take 10 mg by mouth at bedtime.     No current facility-administered medications for this visit.    Allergies No Known Allergies  Histories Past Medical History:  Diagnosis Date   Anemia    Chronic back pain    Colon polyp    Diabetes (HCC)    Elevated PSA    Glaucoma    Gout    Heel spur    Hyperlipemia    Hypertension    Insomnia    Kidney stone    Neuropathy    PVC (premature ventricular contraction)    Stroke [facility])    Past Surgical History:  Procedure Laterality Date   CARDIOVASCULAR STRESS TEST     COLONOSCOPY  09/2015   COLONOSCOPY WITH PROPOFOL N/A 10/22/2022   Procedure: COLONOSCOPY WITH PROPOFOL;  Surgeon: Sherrilyn Rist, MD;  Location: [facility];  Service: Gastroenterology;  Laterality: N/A;   CYSTECTOMY  2000   cyst on nerve on his spine   ENDOSCOPIC MUCOSAL RESECTION N/A 10/22/2022   Procedure: ENDOSCOPIC MUCOSAL RESECTION;  Surgeon: Sherrilyn Rist, MD;  Location: [facility];  Service: Gastroenterology;  Laterality: N/A;   ESOPHAGOGASTRODUODENOSCOPY     HEMOSTASIS CLIP PLACEMENT  10/22/2022   Procedure: HEMOSTASIS CLIP PLACEMENT;  Surgeon: Sherrilyn Rist, MD;  Location: [facility];  Service: Gastroenterology;;   HERNIA REPAIR  2010   umbilicial   holter mon     PENECTOMY  1995   POLYPECTOMY  10/22/2022   Procedure: POLYPECTOMY;  Surgeon: Sherrilyn Rist, MD;  Location: [facility];  Service: Gastroenterology;;   Sunnie Nielsen LIFTING INJECTION  10/22/2022   Procedure: SUBMUCOSAL LIFTING INJECTION;  Surgeon: Sherrilyn Rist, MD;  Location: [facility];  Service: Gastroenterology;;   TONSILLECTOMY     Social History   Socioeconomic History   Marital status: Married    Spouse name: irene   Number of children: 1   Years of education: college   Highest education level: Professional school degree (e.g., MD, DDS, DVM, JD)  Occupational History   Occupation: retired crim [employer]  Tobacco Use  Smoking status: Former   Smokeless tobacco: Never  Vaping Use   Vaping status: Never Used  Substance and Sexual Activity   Alcohol use: Yes    Alcohol/week: 5.0 standard drinks of alcohol    Types: 5 Standard drinks or equivalent per week    Comment: occ   Drug use: Not Currently    Types: Marijuana    Comment: rare   Sexual activity: Not on file  Other Topics Concern   Not on file  Social History Narrative   Lives at home with his wife.   Right-handed.   Caffeine use: 4 diet sodas per day.   Social Determinants of Health   Financial Resource Strain: Not on file  Food Insecurity: Not on file  Transportation Needs: Not on file  Physical Activity: Not on file  Stress: Not on file  Social Connections: Not on file  Intimate Partner Violence: Not on file   Family History  Problem Relation Age of Onset   Colon cancer Mother        in late 65's   Diabetes Mother    Hypertension Mother    Stroke Father    Esophageal cancer Neg Hx    Liver cancer Neg Hx    Pancreatic cancer Neg Hx    Stomach cancer Neg Hx    Colon polyps Neg Hx    Rectal cancer Neg Hx    I have reviewed his medical, social, and family history in detail  and updated the electronic medical record as necessary.    PHYSICAL EXAMINATION  BP (!) 150/60 (BP Location: Left Arm, Patient Position: Sitting, Cuff Size: Normal)   Pulse 76   Ht 5\' 9"  (1.753 m) Comment: height measured without shoes  Wt 182 lb 8 oz (82.8 kg)   BMI 26.95 kg/m  Wt Readings from Last 3 Encounters:  08/08/23 182 lb 8 oz (82.8 kg)  07/30/23 184 lb 3.2 oz (83.6 kg)  10/22/22 175 lb (79.4 kg)  GEN: NAD, appears stated age, doesn't appear chronically ill PSYCH: Cooperative, without pressured speech EYE: Conjunctivae pink, sclerae anicteric ENT: MMM CV: Nontachycardic RESP: No audible wheezing GI: NABS, soft, protuberant abdomen, rounded, NT, without rebound MSK/EXT: No significant lower extremity edema SKIN: No jaundice NEURO:  Alert & Oriented x 3, no focal deficits   REVIEW OF DATA  I reviewed the following data at the time of this encounter:  GI Procedures and Studies  1/24 Colonoscopy - One 15 mm polyp in the proximal transverse colon, removed piecemeal using a cold snare. Resected and retrieved. Clips were placed. - Three 4 to 6 mm polyps in the transverse colon, removed with a cold snare. Resected and retrieved. - Six 5 to 8 mm polyps in the transverse colon, removed with a cold snare. Resected and retrieved. - Three 4 to 6 mm polyps in the transverse colon, removed with a cold snare. Resected and retrieved. - One 35 mm polyp at the hepatic flexure, removed with mucosal resection. Polyp resection was incomplete, and the resected tissue was partially retrieved. - Redundant colon. - Diverticulosis in the left colon. - The examination was otherwise normal on direct and retroflexion views. - Mucosal resection was performed. Resection was incomplete, and the resected tissue was partially retrieved.  Pathology FINAL MICROSCOPIC DIAGNOSIS:  A. COLON, TRANSVERSE X4, POLYPECTOMY:  - Tubular adenoma, multiple fragments.  No high-grade dysplasia or  malignancy.  B. COLON,  TRANSVERSE X6, POLYPECTOMY:  - Tubular adenoma, multiple fragments.  No high-grade dysplasia or  malignancy.  C. COLON, TRANSVERSE X3, POLYPECTOMY:  - Tubular adenoma, 5 fragments.  No high-grade dysplasia or malignancy.  D. COLON, TRANSVERSE, EMR, POLYPECTOMY:  - Tubular adenoma, multiple fragments.  No high-grade dysplasia or  malignancy.   Laboratory Studies  Reviewed those in EPIC  Imaging Studies  No relevant studies to review   ASSESSMENT  Mr. Brackeen is a 82 y.o. 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  The patient is seen today for evaluation and management of:  1. Adenomatous polyp of hepatic flexure   2. Hx of adenomatous colonic polyps   3. Loose bowel movements   4. Antiplatelet or antithrombotic long-term use    The patient is hemodynamically and clinically stable.  Based upon the description and endoscopic pictures I do feel that it is reasonable to pursue an Advanced Polypectomy attempt of the polyp/lesion.  We discussed some of the techniques of advanced polypectomy which include Endoscopic Mucosal Resection, OVESCO Full-Thickness Resection, Endorotor Morcellation, and Tissue Ablation via Fulguration.  We also reviewed images of typical techniques as noted above.  The risks and benefits of endoscopic evaluation were discussed with the patient; these include but are not limited to the risk of perforation, infection, bleeding, missed lesions, lack of diagnosis, severe illness requiring hospitalization, as well as anesthesia and sedation related illnesses.  During attempts at advanced resection, the risks of bleeding and perforation/leak are increased as opposed to diagnostic and screening procedures, and that was discussed with the patient as well.   In addition, I explained that with the possible need for  piecemeal resection, subsequent short-interval endoscopic evaluation for follow up and potential retreatment of the lesion/area may be necessary.  I did offer, a referral to surgery in order for patient to have opportunity to discuss surgical management/intervention prior to finalizing decision for attempt at endoscopic removal, however, the patient deferred on this.  If, after attempt at removal of the polyp/lesion, it is found that the patient has a complication or that an invasive lesion or malignant lesion is found, or that the polyp/lesion continues to recur, the patient is aware and understands that surgery may still be indicated/required.  With the patient's longer standing loose bowel movements, we will consider the role of biopsying to rule out microscopic/collagenous colitis.  All patient questions were answered, to the best of my ability, and the patient agrees to the aforementioned plan of action with follow-up as indicated.   PLAN  Preprocedure labs as outlined below Proceed with scheduled colonoscopy - Attempt at further resection of previous incomplete hepatic flexure polyp - Rule out microscopic/collagenous colitis in setting of his longstanding looser bowel movements Obtain approval for Plavix hold 5 days prior to procedure Consideration of further workup with hematology for normocytic anemia if he desires   Orders Placed This Encounter  Procedures   CBC   Basic Metabolic Panel (BMET)   Ambulatory referral to Gastroenterology    New Prescriptions   No medications on file   Modified Medications   No medications on file    Planned Follow Up No follow-ups on file.   Total Time in Face-to-Face and in Coordination of Care for patient including independent/personal interpretation/review of prior testing, medical history, examination, medication adjustment, communicating results with the patient directly, and documentation within the EHR is 40 minutes.   Corliss Parish,  MD [facility] Advanced [facility]oscopy Office #  [phone]  

## 2023-08-08 NOTE — Patient Instructions (Signed)
Your provider has requested that you go to the basement level for lab work before leaving today. Press "B" on the elevator. The lab is located at the first door on the left as you exit the elevator.  Due to recent changes in healthcare laws, you may see the results of your imaging and laboratory studies on MyChart before your provider has had a chance to review them.  We understand that in some cases there may be results that are confusing or concerning to you. Not all laboratory results come back in the same time frame and the provider may be waiting for multiple results in order to interpret others.  Please give [place] 48 hours in order for your provider to thoroughly review all the results before contacting the office for clarification of your results.   You will be contaced by our office prior to your procedure for directions on holding your Plavix.  If you do not hear from our office 1 week prior to your scheduled procedure, please call [phone] to discuss.  _______________________________________________________  If your blood pressure at your visit was 140/90 or greater, please contact your primary care physician to follow up on this.  _______________________________________________________  If you are age 24 or older, your body mass index should be between 23-30. Your Body mass index is 26.95 kg/m. If this is out of the aforementioned range listed, please consider follow up with your Primary Care Provider.  If you are age 74 or younger, your body mass index should be between 19-25. Your Body mass index is 26.95 kg/m. If this is out of the aformentioned range listed, please consider follow up with your Primary Care Provider.   ________________________________________________________  The [facility] providers would like to encourage you to use [facility] to communicate with providers for non-urgent requests or questions.  Due to long hold times on the telephone, sending your provider a message  by [facility] may be a faster and more efficient way to get a response.  Please allow 48 business hours for a response.  Please remember that this is for non-urgent requests.  _______________________________________________________  Thank you for choosing me and [facility].  Dr. Meridee Score

## 2023-08-08 NOTE — H&P (View-Only) (Signed)
GASTROENTEROLOGY OUTPATIENT CLINIC VISIT   Primary Care Provider Alysia Penna, MD [address] [place] [address] [phone]  Referring Provider Alysia Penna, MD [address] [place],  [place] [address] [phone]  Patient Profile: Aaron Oliver is a 82 y.o. 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  The patient presents to the [facility] for an evaluation and management of problem(s) noted below:  Problem List 1. Adenomatous polyp of hepatic flexure   2. Hx of adenomatous colonic polyps   3. Loose bowel movements   4. Antiplatelet or antithrombotic long-term use    Discussed the use of AI scribe software for clinical note transcription with the patient, who gave verbal consent to proceed.  History of Present Illness Please see prior GI notes for full details of HPI.  Interval History This is a patient of Dr. Myrtie Neither, whom I am seeing with a history of multiple precancerous polyps for which an ascending colon polyp was incompletely resected and for which advanced resection has been requested to try and prevent surgery.  Full colonoscopy report is documented below.  The patient expresses concern about the increased risk of perforation and other complications with another attempt at resection due to their age.   Also, he has some reservations about the frequency of colonoscopies at his age.  The patient has had looser stools for a while and to regulate this, he takes Metamucil daily, which does help (but if he misses Metamucil, he may have multiple loose BMs).  He has not had any blood in his stools.  The patient also reported a long-standing issue with anemia, which has been present for over 20 years of an unclear etiology (last blood count was in 2023).  He has seen a hematologist >10  years ago and never had a bone marrow biopsy.   GI Review of Systems Positive as above Negative for dysphagia, odynophagia, abdominal pain, melena, hematochezia  Review of Systems General: Denies fevers/chills/weight loss unintentionally Cardiovascular: Denies chest pain Pulmonary: Denies shortness of breath Gastroenterological: See HPI Genitourinary: Denies darkened urine Hematological: Denies easy bruising/bleeding Endocrine: Denies temperature intolerance Dermatological: Denies jaundice Psychological: Mood is stable   Medications Current Outpatient Medications  Medication Sig Dispense Refill   acetaminophen (TYLENOL) 500 MG tablet Take 500 mg by mouth every 8 (eight) hours as needed for moderate pain (pain score 4-6).     allopurinol (ZYLOPRIM) 100 MG tablet Take 100 mg by mouth in the morning.     Alpha-Lipoic Acid 600 MG CAPS Take 600 mg by mouth in the morning.     Blood Glucose Monitoring Suppl (ONE TOUCH ULTRA 2) w/Device KIT Use to test blood sugars in vitro once a day     carvedilol (COREG) 12.5 MG tablet Take 12.5 mg by mouth in the morning and at bedtime.     Cholecalciferol (VITAMIN D3) 50 MCG (2000 UT) TABS Take 2,000 Units by mouth in the morning.     clopidogrel (PLAVIX) 75 MG tablet Take 75 mg by mouth in the morning.     colchicine 0.6 MG tablet Take 0.6 mg by mouth in the morning.     dorzolamide-timolol (COSOPT) 2-0.5 % ophthalmic solution Place 1 drop into the right eye 2 (two) times daily.     empagliflozin (JARDIANCE) 10 MG TABS tablet Take 1 tablet (10 mg total) by mouth daily before breakfast.  30 tablet 5   empagliflozin (JARDIANCE) 10 MG TABS tablet Take 1 tablet (10 mg total) by mouth daily before breakfast.     Ferrous Sulfate (IRON) 325 (65 Fe) MG TABS Take 325 mg by mouth in the morning.     gabapentin (NEURONTIN) 100 MG capsule Take 100 mg by mouth in the morning and at bedtime. Takes with the 600 mg tablet twice daily     gabapentin (NEURONTIN) 600 MG  tablet Take 1 tablet (600 mg total) by mouth 3 (three) times daily. (Patient taking differently: Take 600 mg by mouth in the morning and at bedtime. Takes with the 100 mg capsule twice daily) 270 tablet 3   LORazepam (ATIVAN) 1 MG tablet Take 1 mg by mouth 2 (two) times daily as needed for anxiety.     metFORMIN (GLUCOPHAGE) 1000 MG tablet Take 1,000 mg by mouth in the morning and at bedtime.     Multiple Vitamin (MULTIVITAMIN WITH MINERALS) TABS tablet Take 1 tablet by mouth in the morning.     rosuvastatin (CRESTOR) 20 MG tablet Take 20 mg by mouth every evening.     sildenafil (VIAGRA) 100 MG tablet Take 100 mg by mouth daily as needed for erectile dysfunction.     valsartan-hydrochlorothiazide (DIOVAN HCT) 320-25 MG tablet Take 1 tablet by mouth daily. 90 tablet 3   zolpidem (AMBIEN) 10 MG tablet Take 10 mg by mouth at bedtime.     No current facility-administered medications for this visit.    Allergies No Known Allergies  Histories Past Medical History:  Diagnosis Date   Anemia    Chronic back pain    Colon polyp    Diabetes (HCC)    Elevated PSA    Glaucoma    Gout    Heel spur    Hyperlipemia    Hypertension    Insomnia    Kidney stone    Neuropathy    PVC (premature ventricular contraction)    Stroke [facility])    Past Surgical History:  Procedure Laterality Date   CARDIOVASCULAR STRESS TEST     COLONOSCOPY  09/2015   COLONOSCOPY WITH PROPOFOL N/A 10/22/2022   Procedure: COLONOSCOPY WITH PROPOFOL;  Surgeon: Sherrilyn Rist, MD;  Location: [facility];  Service: Gastroenterology;  Laterality: N/A;   CYSTECTOMY  2000   cyst on nerve on his spine   ENDOSCOPIC MUCOSAL RESECTION N/A 10/22/2022   Procedure: ENDOSCOPIC MUCOSAL RESECTION;  Surgeon: Sherrilyn Rist, MD;  Location: [facility];  Service: Gastroenterology;  Laterality: N/A;   ESOPHAGOGASTRODUODENOSCOPY     HEMOSTASIS CLIP PLACEMENT  10/22/2022   Procedure: HEMOSTASIS CLIP PLACEMENT;  Surgeon: Sherrilyn Rist, MD;  Location: [facility];  Service: Gastroenterology;;   HERNIA REPAIR  2010   umbilicial   holter mon     PENECTOMY  1995   POLYPECTOMY  10/22/2022   Procedure: POLYPECTOMY;  Surgeon: Sherrilyn Rist, MD;  Location: [facility];  Service: Gastroenterology;;   Sunnie Nielsen LIFTING INJECTION  10/22/2022   Procedure: SUBMUCOSAL LIFTING INJECTION;  Surgeon: Sherrilyn Rist, MD;  Location: [facility];  Service: Gastroenterology;;   TONSILLECTOMY     Social History   Socioeconomic History   Marital status: Married    Spouse name: irene   Number of children: 1   Years of education: college   Highest education level: Professional school degree (e.g., MD, DDS, DVM, JD)  Occupational History   Occupation: retired crim [employer]  Tobacco Use  Smoking status: Former   Smokeless tobacco: Never  Vaping Use   Vaping status: Never Used  Substance and Sexual Activity   Alcohol use: Yes    Alcohol/week: 5.0 standard drinks of alcohol    Types: 5 Standard drinks or equivalent per week    Comment: occ   Drug use: Not Currently    Types: Marijuana    Comment: rare   Sexual activity: Not on file  Other Topics Concern   Not on file  Social History Narrative   Lives at home with his wife.   Right-handed.   Caffeine use: 4 diet sodas per day.   Social Determinants of Health   Financial Resource Strain: Not on file  Food Insecurity: Not on file  Transportation Needs: Not on file  Physical Activity: Not on file  Stress: Not on file  Social Connections: Not on file  Intimate Partner Violence: Not on file   Family History  Problem Relation Age of Onset   Colon cancer Mother        in late 60's   Diabetes Mother    Hypertension Mother    Stroke Father    Esophageal cancer Neg Hx    Liver cancer Neg Hx    Pancreatic cancer Neg Hx    Stomach cancer Neg Hx    Colon polyps Neg Hx    Rectal cancer Neg Hx    I have reviewed his medical, social, and family history in detail  and updated the electronic medical record as necessary.    PHYSICAL EXAMINATION  BP (!) 150/60 (BP Location: Left Arm, Patient Position: Sitting, Cuff Size: Normal)   Pulse 76   Ht 5\' 9"  (1.753 m) Comment: height measured without shoes  Wt 182 lb 8 oz (82.8 kg)   BMI 26.95 kg/m  Wt Readings from Last 3 Encounters:  08/08/23 182 lb 8 oz (82.8 kg)  07/30/23 184 lb 3.2 oz (83.6 kg)  10/22/22 175 lb (79.4 kg)  GEN: NAD, appears stated age, doesn't appear chronically ill PSYCH: Cooperative, without pressured speech EYE: Conjunctivae pink, sclerae anicteric ENT: MMM CV: Nontachycardic RESP: No audible wheezing GI: NABS, soft, protuberant abdomen, rounded, NT, without rebound MSK/EXT: No significant lower extremity edema SKIN: No jaundice NEURO:  Alert & Oriented x 3, no focal deficits   REVIEW OF DATA  I reviewed the following data at the time of this encounter:  GI Procedures and Studies  1/24 Colonoscopy - One 15 mm polyp in the proximal transverse colon, removed piecemeal using a cold snare. Resected and retrieved. Clips were placed. - Three 4 to 6 mm polyps in the transverse colon, removed with a cold snare. Resected and retrieved. - Six 5 to 8 mm polyps in the transverse colon, removed with a cold snare. Resected and retrieved. - Three 4 to 6 mm polyps in the transverse colon, removed with a cold snare. Resected and retrieved. - One 35 mm polyp at the hepatic flexure, removed with mucosal resection. Polyp resection was incomplete, and the resected tissue was partially retrieved. - Redundant colon. - Diverticulosis in the left colon. - The examination was otherwise normal on direct and retroflexion views. - Mucosal resection was performed. Resection was incomplete, and the resected tissue was partially retrieved.  Pathology FINAL MICROSCOPIC DIAGNOSIS:  A. COLON, TRANSVERSE X4, POLYPECTOMY:  - Tubular adenoma, multiple fragments.  No high-grade dysplasia or  malignancy.  B. COLON,  TRANSVERSE X6, POLYPECTOMY:  - Tubular adenoma, multiple fragments.  No high-grade dysplasia or  malignancy.  C. COLON, TRANSVERSE X3, POLYPECTOMY:  - Tubular adenoma, 5 fragments.  No high-grade dysplasia or malignancy.  D. COLON, TRANSVERSE, EMR, POLYPECTOMY:  - Tubular adenoma, multiple fragments.  No high-grade dysplasia or  malignancy.   Laboratory Studies  Reviewed those in EPIC  Imaging Studies  No relevant studies to review   ASSESSMENT  Aaron Oliver is a 82 y.o. 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  The patient is seen today for evaluation and management of:  1. Adenomatous polyp of hepatic flexure   2. Hx of adenomatous colonic polyps   3. Loose bowel movements   4. Antiplatelet or antithrombotic long-term use    The patient is hemodynamically and clinically stable.  Based upon the description and endoscopic pictures I do feel that it is reasonable to pursue an Advanced Polypectomy attempt of the polyp/lesion.  We discussed some of the techniques of advanced polypectomy which include Endoscopic Mucosal Resection, OVESCO Full-Thickness Resection, Endorotor Morcellation, and Tissue Ablation via Fulguration.  We also reviewed images of typical techniques as noted above.  The risks and benefits of endoscopic evaluation were discussed with the patient; these include but are not limited to the risk of perforation, infection, bleeding, missed lesions, lack of diagnosis, severe illness requiring hospitalization, as well as anesthesia and sedation related illnesses.  During attempts at advanced resection, the risks of bleeding and perforation/leak are increased as opposed to diagnostic and screening procedures, and that was discussed with the patient as well.   In addition, I explained that with the possible need for  piecemeal resection, subsequent short-interval endoscopic evaluation for follow up and potential retreatment of the lesion/area may be necessary.  I did offer, a referral to surgery in order for patient to have opportunity to discuss surgical management/intervention prior to finalizing decision for attempt at endoscopic removal, however, the patient deferred on this.  If, after attempt at removal of the polyp/lesion, it is found that the patient has a complication or that an invasive lesion or malignant lesion is found, or that the polyp/lesion continues to recur, the patient is aware and understands that surgery may still be indicated/required.  With the patient's longer standing loose bowel movements, we will consider the role of biopsying to rule out microscopic/collagenous colitis.  All patient questions were answered, to the best of my ability, and the patient agrees to the aforementioned plan of action with follow-up as indicated.   PLAN  Preprocedure labs as outlined below Proceed with scheduled colonoscopy - Attempt at further resection of previous incomplete hepatic flexure polyp - Rule out microscopic/collagenous colitis in setting of his longstanding looser bowel movements Obtain approval for Plavix hold 5 days prior to procedure Consideration of further workup with hematology for normocytic anemia if he desires   Orders Placed This Encounter  Procedures   CBC   Basic Metabolic Panel (BMET)   Ambulatory referral to Gastroenterology    New Prescriptions   No medications on file   Modified Medications   No medications on file    Planned Follow Up No follow-ups on file.   Total Time in Face-to-Face and in Coordination of Care for patient including independent/personal interpretation/review of prior testing, medical history, examination, medication adjustment, communicating results with the patient directly, and documentation within the EHR is 40 minutes.   Corliss Parish,  MD [facility] Advanced Endoscopy Office #  [phone]  

## 2023-08-08 NOTE — Telephone Encounter (Signed)
   Patient Name: Aaron Oliver  DOB: [birth year] MRN: [id number]  Primary Cardiologist: Orbie Pyo, MD  Chart reviewed as part of pre-operative protocol coverage.   Patient takes Plavix for history of CVA. This is not managed by cardiology.  Recommendations for holding Plavix prior to procedure should come from managing provider (Neurology/PCP).   I will route this recommendation to the requesting party via Epic fax function and remove from pre-op pool.  Please call with questions.  Joylene Grapes, NP 08/08/2023, 10:04 AM

## 2023-08-08 NOTE — Telephone Encounter (Signed)
Request for surgical clearance:     Endoscopy Procedure  What type of surgery is being performed?    Colon +EMR   When is this surgery scheduled?     09/01/23  What type of clearance is required ?   Pharmacy  Are there any medications that need to be held prior to surgery and how long? Plavix x5 days prior to procedure  Practice name and name of physician performing surgery?      [facility]  What is your office phone and fax number?      Phone- [phone]  Fax- [phone]  Anesthesia type (None, local, MAC, general) ?       MAC

## 2023-08-10 ENCOUNTER — Encounter: Payer: Self-pay | Admitting: Gastroenterology

## 2023-08-10 DIAGNOSIS — Z860101 Personal history of adenomatous and serrated colon polyps: Secondary | ICD-10-CM | POA: Insufficient documentation

## 2023-08-10 DIAGNOSIS — R195 Other fecal abnormalities: Secondary | ICD-10-CM | POA: Insufficient documentation

## 2023-08-10 DIAGNOSIS — Z7902 Long term (current) use of antithrombotics/antiplatelets: Secondary | ICD-10-CM | POA: Insufficient documentation

## 2023-08-10 DIAGNOSIS — D649 Anemia, unspecified: Secondary | ICD-10-CM | POA: Insufficient documentation

## 2023-08-10 DIAGNOSIS — D122 Benign neoplasm of ascending colon: Secondary | ICD-10-CM | POA: Insufficient documentation

## 2023-08-11 NOTE — Telephone Encounter (Signed)
I do not see that I sent that when I set up the case. Thank you for catching it.

## 2023-08-15 NOTE — Addendum Note (Signed)
Addended by: Lendon Ka on: 08/15/2023 03:28 PM   Modules accepted: Orders

## 2023-08-20 NOTE — Telephone Encounter (Signed)
Re-faxed request to Dr Link Snuffer office.

## 2023-08-25 ENCOUNTER — Encounter (HOSPITAL_COMMUNITY): Payer: Self-pay | Admitting: Gastroenterology

## 2023-08-28 NOTE — Telephone Encounter (Signed)
Recd call from [facility] from Dr Link Snuffer office, okay for patient to hold Plavix x5 day prior to procedure. Per the patient he was contacted 08/27/23 by their office. His last dose of Plavix was 08/27/23.

## 2023-09-01 ENCOUNTER — Ambulatory Visit (HOSPITAL_COMMUNITY)
Admission: RE | Admit: 2023-09-01 | Discharge: 2023-09-01 | Disposition: A | Discharge status: Home / Self Care | Payer: Medicare Other | Source: Ambulatory Visit | Attending: Gastroenterology | Admitting: Gastroenterology

## 2023-09-01 ENCOUNTER — Ambulatory Visit (HOSPITAL_COMMUNITY): Payer: Medicare Other | Admitting: Anesthesiology

## 2023-09-01 ENCOUNTER — Encounter (HOSPITAL_COMMUNITY)
Admission: RE | Disposition: A | Discharge status: Home / Self Care | Payer: Self-pay | Source: Ambulatory Visit | Attending: Gastroenterology

## 2023-09-01 ENCOUNTER — Encounter (HOSPITAL_COMMUNITY): Payer: Self-pay | Admitting: Gastroenterology

## 2023-09-01 DIAGNOSIS — Z8673 Personal history of transient ischemic attack (TIA), and cerebral infarction without residual deficits: Secondary | ICD-10-CM | POA: Insufficient documentation

## 2023-09-01 DIAGNOSIS — K5730 Diverticulosis of large intestine without perforation or abscess without bleeding: Secondary | ICD-10-CM | POA: Diagnosis not present

## 2023-09-01 DIAGNOSIS — Z800 Family history of malignant neoplasm of digestive organs: Secondary | ICD-10-CM | POA: Diagnosis not present

## 2023-09-01 DIAGNOSIS — E119 Type 2 diabetes mellitus without complications: Secondary | ICD-10-CM | POA: Diagnosis not present

## 2023-09-01 DIAGNOSIS — D124 Benign neoplasm of descending colon: Secondary | ICD-10-CM | POA: Diagnosis not present

## 2023-09-01 DIAGNOSIS — D759 Disease of blood and blood-forming organs, unspecified: Secondary | ICD-10-CM | POA: Diagnosis not present

## 2023-09-01 DIAGNOSIS — Z7902 Long term (current) use of antithrombotics/antiplatelets: Secondary | ICD-10-CM | POA: Diagnosis not present

## 2023-09-01 DIAGNOSIS — M109 Gout, unspecified: Secondary | ICD-10-CM | POA: Insufficient documentation

## 2023-09-01 DIAGNOSIS — D649 Anemia, unspecified: Secondary | ICD-10-CM | POA: Insufficient documentation

## 2023-09-01 DIAGNOSIS — Z87891 Personal history of nicotine dependence: Secondary | ICD-10-CM | POA: Insufficient documentation

## 2023-09-01 DIAGNOSIS — Z7984 Long term (current) use of oral hypoglycemic drugs: Secondary | ICD-10-CM | POA: Insufficient documentation

## 2023-09-01 DIAGNOSIS — Z860101 Personal history of adenomatous and serrated colon polyps: Secondary | ICD-10-CM

## 2023-09-01 DIAGNOSIS — D125 Benign neoplasm of sigmoid colon: Secondary | ICD-10-CM

## 2023-09-01 DIAGNOSIS — I10 Essential (primary) hypertension: Secondary | ICD-10-CM | POA: Diagnosis not present

## 2023-09-01 DIAGNOSIS — E785 Hyperlipidemia, unspecified: Secondary | ICD-10-CM | POA: Diagnosis not present

## 2023-09-01 DIAGNOSIS — Z1211 Encounter for screening for malignant neoplasm of colon: Secondary | ICD-10-CM | POA: Diagnosis not present

## 2023-09-01 DIAGNOSIS — D122 Benign neoplasm of ascending colon: Secondary | ICD-10-CM

## 2023-09-01 DIAGNOSIS — K644 Residual hemorrhoidal skin tags: Secondary | ICD-10-CM | POA: Diagnosis not present

## 2023-09-01 DIAGNOSIS — Z9889 Other specified postprocedural states: Secondary | ICD-10-CM

## 2023-09-01 DIAGNOSIS — D123 Benign neoplasm of transverse colon: Secondary | ICD-10-CM | POA: Insufficient documentation

## 2023-09-01 DIAGNOSIS — K641 Second degree hemorrhoids: Secondary | ICD-10-CM | POA: Insufficient documentation

## 2023-09-01 HISTORY — PX: HOT HEMOSTASIS: SHX5433

## 2023-09-01 HISTORY — PX: SUBMUCOSAL TATTOO INJECTION: SHX6856

## 2023-09-01 HISTORY — PX: COLONOSCOPY WITH PROPOFOL: SHX5780

## 2023-09-01 HISTORY — PX: HEMOSTASIS CLIP PLACEMENT: SHX6857

## 2023-09-01 HISTORY — PX: POLYPECTOMY: SHX5525

## 2023-09-01 HISTORY — PX: ENDOSCOPIC MUCOSAL RESECTION: SHX6839

## 2023-09-01 HISTORY — PX: SUBMUCOSAL LIFTING INJECTION: SHX6855

## 2023-09-01 LAB — GLUCOSE, CAPILLARY: Glucose-Capillary: 91 mg/dL (ref 70–99)

## 2023-09-01 SURGERY — COLONOSCOPY WITH PROPOFOL
Anesthesia: Monitor Anesthesia Care

## 2023-09-01 MED ORDER — PROPOFOL 10 MG/ML IV BOLUS
INTRAVENOUS | Status: DC | PRN
  Administered 2023-09-01: 150 ug/kg/min via INTRAVENOUS

## 2023-09-01 MED ORDER — SODIUM CHLORIDE 0.9 % IV SOLN: INTRAVENOUS | Status: DC | PRN

## 2023-09-01 MED ORDER — PROPOFOL 1000 MG/100ML IV EMUL
INTRAVENOUS | Status: AC
  Filled 2023-09-01: qty 100

## 2023-09-01 MED ORDER — SPOT INK MARKER SYRINGE KIT
PACK | SUBMUCOSAL | Status: DC | PRN
  Administered 2023-09-01: 1 mL via SUBMUCOSAL

## 2023-09-01 MED ORDER — CLOPIDOGREL BISULFATE 75 MG PO TABS: 75.0000 mg | ORAL_TABLET | Freq: Every morning | ORAL | Status: AC

## 2023-09-01 MED ORDER — EPHEDRINE SULFATE (PRESSORS) 50 MG/ML IJ SOLN
INTRAMUSCULAR | Status: DC | PRN
  Administered 2023-09-01 (×2): 10 mg via INTRAVENOUS

## 2023-09-01 MED ORDER — PROPOFOL 500 MG/50ML IV EMUL
INTRAVENOUS | Status: AC
  Filled 2023-09-01: qty 50

## 2023-09-01 SURGICAL SUPPLY — 20 items

## 2023-09-01 NOTE — Interval H&P Note (Signed)
History and Physical Interval Note:  09/01/2023 7:55 AM  Aaron Oliver  has presented today for surgery, with the diagnosis of colon polyp.  The various methods of treatment have been discussed with the patient and family. After consideration of risks, benefits and other options for treatment, the patient has consented to  Procedure(s): COLONOSCOPY WITH PROPOFOL (N/A) ENDOSCOPIC MUCOSAL RESECTION (N/A) as a surgical intervention.  The patient's history has been reviewed, patient examined, no change in status, stable for surgery.  I have reviewed the patient's chart and labs.  Questions were answered to the patient's satisfaction.     [employer]

## 2023-09-01 NOTE — Transfer of Care (Signed)
Immediate Anesthesia Transfer of Care Note  Patient: Aaron Oliver  Procedure(s) Performed: COLONOSCOPY WITH PROPOFOL ENDOSCOPIC MUCOSAL RESECTION POLYPECTOMY HOT HEMOSTASIS (ARGON PLASMA COAGULATION/BICAP) HEMOSTASIS CLIP PLACEMENT SUBMUCOSAL TATTOO INJECTION SUBMUCOSAL LIFTING INJECTION  Patient Location: PACU  Anesthesia Type:MAC  Level of Consciousness: awake and alert   Airway & Oxygen Therapy: Patient Spontanous Breathing  Post-op Assessment: Report given to RN  Post vital signs: Reviewed and stable  Last Vitals:  Vitals Value Taken Time  BP 113/37 09/01/23 0913  Temp 36.1   Pulse 70 09/01/23 0915  Resp 19 09/01/23 0915  SpO2 96 % 09/01/23 0915  Vitals shown include unfiled device data.  Last Pain:  Vitals:   09/01/23 0704  TempSrc: Temporal  PainSc: 0-No pain         Complications: No notable events documented.

## 2023-09-01 NOTE — Anesthesia Postprocedure Evaluation (Signed)
Anesthesia Post Note  Patient: Aaron Oliver  Procedure(s) Performed: COLONOSCOPY WITH PROPOFOL ENDOSCOPIC MUCOSAL RESECTION POLYPECTOMY HOT HEMOSTASIS (ARGON PLASMA COAGULATION/BICAP) HEMOSTASIS CLIP PLACEMENT SUBMUCOSAL TATTOO INJECTION SUBMUCOSAL LIFTING INJECTION     Patient location during evaluation: Endoscopy Anesthesia Type: MAC Level of consciousness: awake and alert Pain management: pain level controlled Vital Signs Assessment: post-procedure vital signs reviewed and stable Respiratory status: spontaneous breathing, nonlabored ventilation, respiratory function stable and patient connected to nasal cannula oxygen Cardiovascular status: stable and blood pressure returned to baseline Postop Assessment: no apparent nausea or vomiting Anesthetic complications: no   No notable events documented.  Last Vitals:  Vitals:   09/01/23 0930 09/01/23 0940  BP: (!) 132/42 (!) 134/48  Pulse: 68 63  Resp: (!) 22 20  Temp:    SpO2: 95% 98%    Last Pain:  Vitals:   09/01/23 0940  TempSrc:   PainSc: 0-No pain                 Earl Lites P [facility]

## 2023-09-01 NOTE — Op Note (Signed)
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 Myrtie Neither, MD Medicines:                Monitored Anesthesia Care Complications:            No immediate complications. Estimated Blood Loss:     Estimated blood loss was minimal. Procedure:                Pre-Anesthesia Assessment:                           - Prior to the procedure, a History and Physical                            was performed, and patient medications and                            allergies were reviewed. The patient's tolerance of                            previous anesthesia was also reviewed. The risks                            and benefits of the procedure and the sedation                            options and risks were discussed with the patient.                            All questions were answered, and informed consent                            was obtained. Prior Anticoagulants: The patient has                            taken Plavix (clopidogrel), last dose was 2 days                            prior to procedure. ASA Grade Assessment: III - A  patient with severe systemic disease. After                            reviewing the risks and benefits, the patient was                            deemed in satisfactory condition to undergo the                            procedure.                           After obtaining informed consent, the colonoscope                             was passed under direct vision. Throughout the                            procedure, the patient's blood pressure, pulse, and                            oxygen saturations were monitored continuously. The                            CF-HQ190L ([id number]) Olympus colonoscope was                            introduced through the anus and advanced to the 3                            cm into the ileum. The colonoscopy was performed                            without difficulty. The patient tolerated the                            procedure. The quality of the bowel preparation was                            adequate. The terminal ileum, ileocecal valve,                            appendiceal orifice, and rectum were photographed. Scope In: 8:08:05 AM Scope Out: 9:07:19 AM Scope Withdrawal Time: 0 hours 52 minutes 36 seconds  Total Procedure Duration: 0 hours 59 minutes 14 seconds  Findings:      The digital rectal exam findings include hemorrhoids. Pertinent       negatives include no palpable rectal lesions.      Skin tags were found on perianal exam.      The terminal ileum and ileocecal valve appeared normal.      A moderate amount of semi-liquid stool was found in the entire colon,       interfering with visualization. Lavage of the area was performed using       copious amounts, resulting in clearance with adequate visualization.  A medium post mucosectomy scar was found at the hepatic flexure region.       There were 2 areas of what appeared to be recurrent/persistent polypoid       tissue (approximately 20 mm in size). Preparations were made for attempt       at further mucosal resection. Demarcation of the lesion was performed       with high-definition white light and narrow band imaging to clearly       identify the boundaries of the lesion. Underwater immersion was pursued       to raise and lift the lesion from the mucosa. Piecemeal mucosal       resection using a snare was  performed. Resection and retrieval were       complete. Resected tissue margins were examined and clear of polyp       tissue. Coagulation for tissue destruction using snare tip soft       coagulation to the margin was successful. To prevent bleeding after       mucosal resection, four hemostatic clips were successfully placed. Clip       manufacturer: [employer]. There was no bleeding at the end of the       procedure.      A medium post mucosectomy scar was found at the hepatic flexure. There       was residual/persistent polypoid tissue (approximately 25 mm in size).       Preparations were made for mucosal resection. Demarcation of the lesion       was performed with high-definition white light and narrow band imaging       to clearly identify the boundaries of the lesion. EverLift was injected       to raise the lesion. Piecemeal mucosal resection using a snare was       performed. Resection was incomplete. The resected tissue was retrieved.       Coagulation for tissue destruction using forcep avulsion was successful.       Fulguration to ablate the lesion margin by snare tip soft coagulation       was successful. To prevent bleeding after mucosal resection, two       hemostatic clips were successfully placed (MR conditional). Clip       manufacturer: [employer]. There was no bleeding during, or at the       end, of the procedure.      After both resections were complete, area distal to the 2 hepatic       flexure EMR within the in the proximal transverse colon was tattooed       with an injection of Spot (carbon black) for demarcation purposes.      Six sessile polyps were found in the sigmoid colon (1), descending colon       (1), transverse colon (2), hepatic flexure (1) and ascending colon (1).       The polyps were 2 to 10 mm in size. These polyps were removed with a       cold snare. Resection and retrieval were complete.      Many medium-mouthed and small-mouthed  diverticula were found in the       entire colon.      Normal mucosa was found in the entire colon otherwise.      Non-bleeding non-thrombosed external and internal hemorrhoids were found       during retroflexion, during perianal exam and during digital exam. The  hemorrhoids were Grade II (internal hemorrhoids that prolapse but reduce       spontaneously). Impression:               - Hemorrhoids found on digital rectal exam.                            Perianal skin tags found on perianal exam.                           - The examined portion of the ileum was normal.                           - Stool in the entire examined colon - lavaged with                            adequate visualization.                           - Post mucosectomy scar at the hepatic flexure.                            Piecemeal EMR of recurrent/persistent polyp                            performed. STSC to the margin. Clips were placed.                            Clip manufacturer: [employer].                           - Post mucosectomy scar at the hepatic flexure.                            Piecemeal EMR of recurrent/persistent polyp                            performed. Incomplete resection. Forcep avulsion to                            complete resection. STSC to the margin. Clips (MR                            conditional) were placed. Clip manufacturer: [employer].                           - An area in the proximal transverse colon was                            tattooed to demarcate both resection sites.                           - Six 2 to 10 mm  polyps in the sigmoid colon, in                            the descending colon, in the transverse colon, at                            the hepatic flexure and in the ascending colon,                            removed with a cold snare. Resected and retrieved.                           - Diverticulosis in the entire  examined colon.                           - Normal mucosa in the entire examined colon                            otherwise.                           - Non-bleeding non-thrombosed external and internal                            hemorrhoids. Moderate Sedation:      Not Applicable - Patient had care per Anesthesia. Recommendation:           - The patient will be observed post-procedure,                            until all discharge criteria are met.                           - Discharge patient to home.                           - Patient has a contact number available for                            emergencies. The signs and symptoms of potential                            delayed complications were discussed with the                            patient. Return to normal activities tomorrow.                            Written discharge instructions were provided to the                            patient.                           - High fiber diet.                           -  Use FiberCon 1-2 tablets PO daily.                           - May restart Plavix on 12/11 to decrease risk of                            post interventional bleeding.                           - Continue present medications.                           - Await pathology results.                           - Repeat colonoscopy in 6-9 months for                            surveillance, unless high-grade dysplasia is noted.                           - The findings and recommendations were discussed                            with the patient.                           - The findings and recommendations were discussed                            with the designated responsible adult. Procedure Code(s):        --- Professional ---                           [phone], Colonoscopy, flexible; with endoscopic                            mucosal resection                           45385, 59, Colonoscopy, flexible; with removal of                             tumor(s), polyp(s), or other lesion(s) by snare                            technique                           45381, 59, Colonoscopy, flexible; with directed                            submucosal injection(s), any substance Diagnosis Code(s):        --- Professional ---                           K64.1, Second degree hemorrhoids  Z61.096, Other specified postprocedural states                           D12.5, Benign neoplasm of sigmoid colon                           D12.4, Benign neoplasm of descending colon                           D12.3, Benign neoplasm of transverse colon (hepatic                            flexure or splenic flexure)                           D12.2, Benign neoplasm of ascending colon                           K64.4, Residual hemorrhoidal skin tags                           K63.5, Polyp of colon                           K57.30, Diverticulosis of large intestine without                            perforation or abscess without bleeding CPT copyright 2022 American Medical Association. All rights reserved. The codes documented in this report are preliminary and upon coder review may  be revised to meet current compliance requirements. Corliss Parish, MD 09/01/2023 9:33:09 AM Number of Addenda: 0

## 2023-09-01 NOTE — Anesthesia Preprocedure Evaluation (Signed)
Anesthesia Evaluation  Patient identified by MRN, date of birth, ID band Patient awake    Reviewed: Allergy & Precautions, NPO status , Patient's Chart, lab work & pertinent test results  Airway Mallampati: II  TM Distance: >3 FB Neck ROM: Full    Dental no notable dental hx.    Pulmonary neg pulmonary ROS, former smoker   Pulmonary exam normal        Cardiovascular hypertension, Pt. on medications and Pt. on home beta blockers  Rhythm:Regular Rate:Normal     Neuro/Psych  Headaches CVA  negative psych ROS   GI/Hepatic Neg liver ROS,,,Colon polyp   Endo/Other  diabetes, Type 2, Oral Hypoglycemic Agents    Renal/GU   negative genitourinary   Musculoskeletal negative musculoskeletal ROS (+)    Abdominal Normal abdominal exam  (+)   Peds  Hematology  (+) Blood dyscrasia, anemia Lab Results      Component                Value               Date                      WBC                      5.3                 08/08/2023                HGB                      11.1 (L)            08/08/2023                HCT                      32.6 (L)            08/08/2023                MCV                      86.6                08/08/2023                PLT                      161.0               08/08/2023              Anesthesia Other Findings   Reproductive/Obstetrics                             Anesthesia Physical Anesthesia Plan  ASA: 3  Anesthesia Plan: MAC   Post-op Pain Management:    Induction: Intravenous  PONV Risk Score and Plan: 1 and Propofol infusion and Treatment may vary due to age or medical condition  Airway Management Planned: Simple Face Mask and Nasal Cannula  Additional Equipment: None  Intra-op Plan:   Post-operative Plan:   Informed Consent: I have reviewed the patients History and Physical, chart, labs and discussed the procedure including the risks, benefits  and alternatives for the proposed anesthesia with the patient  or authorized representative who has indicated his/her understanding and acceptance.     Dental advisory given  Plan Discussed with: CRNA  Anesthesia Plan Comments:        Anesthesia Quick Evaluation

## 2023-09-01 NOTE — Discharge Instructions (Signed)

## 2023-09-02 ENCOUNTER — Encounter: Payer: Self-pay | Admitting: Gastroenterology

## 2023-09-02 LAB — SURGICAL PATHOLOGY

## 2023-09-03 ENCOUNTER — Encounter (HOSPITAL_COMMUNITY): Payer: Self-pay | Admitting: Gastroenterology

## 2024-05-15 NOTE — Progress Notes (Signed)
 Cardiology Office Note:   Date:  05/27/2024  ID:  Aaron Oliver, DOB 1941-01-07, MRN [id number] PCP:  Larnell Hamilton, MD  [facility] Providers Cardiologist:  Wendel Haws, MD Referring MD: Larnell Hamilton, MD  Chief Complaint/Reason for Referral: Cardiology follow-up ASSESSMENT:    1. Cerebrovascular accident (CVA), unspecified mechanism (HCC)   2. Type 2 diabetes mellitus with other circulatory complication, without long-term current use of insulin (HCC)   3. Hypertension associated with diabetes (HCC)   4. Hyperlipidemia associated with type 2 diabetes mellitus (HCC)   5. Stage 3a chronic kidney disease (HCC)   6. BMI 26.0-26.9,adult   7. Aortic dilatation (HCC)      PLAN:   In order of problems listed above: History of stroke: Continue Plavix  75 mg, rosuvastatin 20 mg. Type 2 diabetes mellitus: Continue Plavix  75 mg, valsartan /hydrochlorothiazide  160 x 12.5 daily, and rosuvastatin 20 mg; patient will decide about Jardiance  based on GFR that we will check today. Hypertension: Continue Coreg 12.5 mg twice daily, valsartan /hydrochlorothiazide  160 x 12.5 daily, amlodipine 5 mg  Hyperlipidemia: Continue rosuvastatin 20 mg; LDL in August was 25  CKD stage III: Continue  valsartan /hydrochlorothiazide  160 x 12.5 mg daily; will let patient know about GFR based on labs today and he will discuss with his PCP about Jardiance . Elevated BMI: Given advanced age we will defer referral for GLP-1 receptor agonist therapy.  Continue diet and exercise modification. Aortic dilatation: Will obtain CTA to evaluate further.           Dispo:  Return in about 6 months (around 11/24/2024).      Medication Adjustments/Labs and Tests Ordered: Current medicines are reviewed at length with the patient today.  Concerns regarding medicines are outlined above.  The following changes have been made:    Labs/tests ordered: Orders Placed This Encounter  Procedures   CT ANGIO CHEST AORTA W/CM & OR WO/CM    Basic metabolic panel with GFR    Medication Changes: No orders of the defined types were placed in this encounter.   Current medicines are reviewed at length with the patient today.  The patient does not have concerns regarding medicines.  I spent 33 minutes reviewing all clinical data during and prior to this visit including all relevant imaging studies, laboratories, clinical information from other health systems, and prior notes from both Cardiology and other specialties, interviewing the patient, and conducting a complete physical examination in order to formulate a comprehensive and personalized evaluation and treatment plan.  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  He was first seen 2022 after a stroke for concerns due to atrial fibrillation.  He was referred for echocardiogram which was reassuring.  Monitor demonstrated no atrial fibrillation.  He was seen in 2023 and was doing well without cardiovascular complaints.  Echocardiogram at that time demonstrated preserved LV function with mild aortic dilatation.  The patient is under a great deal of caregiver stress.  His wife has an incurable cancer and is in a research trial.  He has to travel to [place] several times a year.  She also has other orthopedic issues that he needs  to attend to.  Recently he has noticed his blood pressure has been elevated.  He brought his blood pressure log in with him which show pretty consistent blood pressures above 130 systolic.  He was seen by his primary care provider's office and amlodipine as needed was  started.  The patient has taken this occasionally.  He however denies any chest pain or shortness of breath.  He has noticed when he lays down on his left side he will notice pulsatile tinnitus.  This concerned him for high blood pressure issues.  He will be seeing ENT due to this issue as well.  Plan: Start Jardiance , increase Diovan  to 320/25, check BMP next week, check lipid panel and LFTs next week; obtain CTA for aortic dilatation at next visit.  September 2025:  Patient consents to use of AI scribe. Labs were not drawn and the patient did not report his blood pressure readings to us .  He saw an ENT specialist who believed that his tinnitus was likely related to high blood pressure.  He reported that his blood pressures had been improved at that visit.  The patient is doing well.  He elected not to start Jardiance  based on side effects and discussion with his PCP.  He is not necessarily against starting in the future and after our discussion centered on his abnormal GFR he would like us  to check his blood work today which would inform his discussion with his PCP in a few weeks.  He is otherwise well.  He is active, mowing his lawn, attending physical therapy twice a week for a bad back, and lifting weights. He feels generally good for his age, although not as good as five years ago.  Unfortunately his wife has incurable cancer which was diagnosed 10 years ago but she is taken more of a turn for the worse recently.            Current Medications: Current Meds  Medication Sig   acetaminophen (TYLENOL) 500 MG tablet Take 500 mg by mouth every 8 (eight) hours as needed for moderate pain (pain score 4-6).   allopurinol (ZYLOPRIM) 100 MG tablet Take 100 mg by mouth in the morning.   Alpha-Lipoic Acid 600 MG CAPS Take 600 mg by mouth in the morning.   amLODipine (NORVASC) 5 MG tablet Take 5 mg by mouth at bedtime.   Blood Glucose Monitoring Suppl (ONE TOUCH ULTRA 2) w/Device KIT Use to test blood  sugars in vitro once a day   carvedilol (COREG) 12.5 MG tablet Take 12.5 mg by mouth in the morning and at bedtime.   Cholecalciferol (VITAMIN D3) 50 MCG (2000 UT) TABS Take 2,000 Units by mouth in the morning.   clopidogrel  (PLAVIX ) 75 MG tablet Take 1 tablet (75 mg total) by mouth in the morning.   colchicine 0.6 MG tablet Take 0.6 mg by mouth in the morning.   dorzolamide-timolol (COSOPT) 2-0.5 % ophthalmic solution Place 1 drop into the right eye 2 (two) times daily.   Ferrous Sulfate (IRON) 325 (65 Fe) MG TABS Take 325 mg by mouth in the morning.   gabapentin  (NEURONTIN ) 100 MG capsule Take 100 mg by mouth in the morning and at bedtime. Takes with the 600 mg tablet twice daily   gabapentin  (NEURONTIN ) 600 MG tablet Take 1 tablet (600 mg total) by mouth 3 (three) times daily. (Patient taking differently: Take 600 mg by mouth in the morning and at bedtime. Takes with the 100 mg capsule twice daily)  LORazepam (ATIVAN) 1 MG tablet Take 1 mg by mouth 2 (two) times daily as needed for anxiety.   metFORMIN (GLUCOPHAGE) 1000 MG tablet Take 1,000 mg by mouth in the morning and at bedtime.   Multiple Vitamin (MULTIVITAMIN WITH MINERALS) TABS tablet Take 1 tablet by mouth in the morning.   rosuvastatin (CRESTOR) 20 MG tablet Take 20 mg by mouth every evening.   sildenafil (VIAGRA) 100 MG tablet Take 100 mg by mouth daily as needed for erectile dysfunction.   valsartan -hydrochlorothiazide  (DIOVAN -HCT) 160-12.5 MG tablet Take 1 tablet by mouth daily.   zolpidem (AMBIEN) 10 MG tablet Take 10 mg by mouth at bedtime.     Review of Systems:   Please see the history of present illness.    All other systems reviewed and are negative.     EKGs/Labs/Other Test Reviewed:   EKG: 2024 normal sinus rhythm  EKG Interpretation Date/Time:    Ventricular Rate:    PR Interval:    QRS Duration:    QT Interval:    QTC Calculation:   R Axis:      Text Interpretation:           Risk  Assessment/Calculations:          Physical Exam:   VS:  BP (!) 130/50   Pulse 64   Ht 5' 9 (1.753 m)   Wt 177 lb (80.3 kg)   SpO2 96%   BMI 26.14 kg/m        Wt Readings from Last 3 Encounters:  05/27/24 177 lb (80.3 kg)  09/01/23 182 lb (82.6 kg)  08/08/23 182 lb 8 oz (82.8 kg)      GENERAL:  No apparent distress, AOx3 HEENT:  No carotid bruits, +2 carotid impulses, no scleral icterus CAR: RRR no murmurs, gallops, rubs, or thrills RES:  Clear to auscultation bilaterally ABD:  Soft, nontender, nondistended, positive bowel sounds x 4 VASC:  +2 radial pulses, +2 carotid pulses NEURO:  CN 2-12 grossly intact; motor and sensory grossly intact PSYCH:  No active depression or anxiety EXT:  No edema, ecchymosis, or cyanosis  Signed, Sael Furches K Georg Ang, MD  05/27/2024 11:20 AM    [facility] [address] [place], [place], [place]  [address] Phone: [phone]; Fax: [phone]   Note:  This document was prepared using Dragon voice recognition software and may include unintentional dictation errors.

## 2024-05-21 ENCOUNTER — Ambulatory Visit: Payer: Self-pay | Admitting: Internal Medicine

## 2024-05-21 LAB — HEPATIC FUNCTION PANEL
ALT: 29 IU/L (ref 0–44)
AST: 29 IU/L (ref 0–40)
Albumin: 4.5 g/dL (ref 3.7–4.7)
Alkaline Phosphatase: 67 IU/L (ref 44–121)
Bilirubin Total: 0.4 mg/dL (ref 0.0–1.2)
Bilirubin, Direct: 0.15 mg/dL (ref 0.00–0.40)
Total Protein: 6.5 g/dL (ref 6.0–8.5)

## 2024-05-21 LAB — LIPID PANEL
Chol/HDL Ratio: 2 ratio (ref 0.0–5.0)
Cholesterol, Total: 99 mg/dL — ABNORMAL LOW (ref 100–199)
HDL: 50 mg/dL (ref >39)
LDL Chol Calc (NIH): 25 mg/dL (ref 0–99)
Triglycerides: 139 mg/dL (ref 0–149)
VLDL Cholesterol Cal: 24 mg/dL (ref 5–40)

## 2024-05-27 ENCOUNTER — Encounter: Payer: Self-pay | Admitting: Internal Medicine

## 2024-05-27 ENCOUNTER — Ambulatory Visit: Attending: Internal Medicine | Admitting: Internal Medicine

## 2024-05-27 VITALS — BP 130/50 | HR 64 | Ht 69.0 in | Wt 177.0 lb

## 2024-05-27 DIAGNOSIS — N1831 Chronic kidney disease, stage 3a: Secondary | ICD-10-CM

## 2024-05-27 DIAGNOSIS — I639 Cerebral infarction, unspecified: Secondary | ICD-10-CM

## 2024-05-27 DIAGNOSIS — E1159 Type 2 diabetes mellitus with other circulatory complications: Secondary | ICD-10-CM

## 2024-05-27 DIAGNOSIS — E1169 Type 2 diabetes mellitus with other specified complication: Secondary | ICD-10-CM

## 2024-05-27 DIAGNOSIS — Z6826 Body mass index (BMI) 26.0-26.9, adult: Secondary | ICD-10-CM

## 2024-05-27 DIAGNOSIS — I152 Hypertension secondary to endocrine disorders: Secondary | ICD-10-CM

## 2024-05-27 DIAGNOSIS — E785 Hyperlipidemia, unspecified: Secondary | ICD-10-CM

## 2024-05-27 DIAGNOSIS — I77819 Aortic ectasia, unspecified site: Secondary | ICD-10-CM

## 2024-05-27 NOTE — Patient Instructions (Addendum)
 Medication Instructions:  Your physician recommends that you continue on your current medications as directed. Please refer to the Current Medication list given to you today.  *If you need a refill on your cardiac medications before your next appointment, please call your pharmacy*  Lab Work: TODAY: BMP If you have labs (blood work) drawn today and your tests are completely normal, you will receive your results only by: MyChart Message (if you have MyChart) OR A paper copy in the mail If you have any lab test that is abnormal or we need to change your treatment, we will call you to review the results.  Testing: Your physician has requested you have a Cardiac CT Angiography (CTA), this is a special type of CT scan that uses a computer to produce multi-dimensional views of major blood vessels throughout the body. In CT angiography, a contrast material is injected through an IV to help visualize the blood vessels    Follow-Up: At [facility], you and your health needs are our priority.  As part of our continuing mission to provide you with exceptional heart care, our providers are all part of one team.  This team includes your primary Cardiologist (physician) and Advanced Practice Providers or APPs (Physician Assistants and Nurse Practitioners) who all work together to provide you with the care you need, when you need it.  Your next appointment:   6 month(s)  Provider:   Arun K Thukkani, MD   We recommend signing up for the patient portal called MyChart.  Sign up information is provided on this After Visit Summary.  MyChart is used to connect with patients for Virtual Visits (Telemedicine).  Patients are able to view lab/test results, encounter notes, upcoming appointments, etc.  Non-urgent messages can be sent to your provider as well.    To learn more about what you can do with MyChart, go to [email or web address].

## 2024-05-29 LAB — BASIC METABOLIC PANEL WITH GFR
BUN/Creatinine Ratio: 20 (ref 10–24)
BUN: 28 mg/dL — ABNORMAL HIGH (ref 8–27)
CO2: 24 mmol/L (ref 20–29)
Calcium: 9.4 mg/dL (ref 8.6–10.2)
Chloride: 101 mmol/L (ref 96–106)
Creatinine, Ser: 1.37 mg/dL — ABNORMAL HIGH (ref 0.76–1.27)
Glucose: 151 mg/dL — ABNORMAL HIGH (ref 70–99)
Potassium: 3.9 mmol/L (ref 3.5–5.2)
Sodium: 142 mmol/L (ref 134–144)
eGFR: 52 mL/min/1.73 — ABNORMAL LOW (ref >59)

## 2024-05-30 ENCOUNTER — Ambulatory Visit: Payer: Self-pay | Admitting: Internal Medicine

## 2024-05-30 DIAGNOSIS — I77819 Aortic ectasia, unspecified site: Secondary | ICD-10-CM

## 2024-06-15 ENCOUNTER — Encounter: Payer: Self-pay | Admitting: Gastroenterology

## 2024-07-19 ENCOUNTER — Ambulatory Visit (HOSPITAL_COMMUNITY)
Admission: RE | Admit: 2024-07-19 | Discharge: 2024-07-19 | Disposition: A | Source: Ambulatory Visit | Attending: Internal Medicine

## 2024-07-19 DIAGNOSIS — J439 Emphysema, unspecified: Secondary | ICD-10-CM | POA: Insufficient documentation

## 2024-07-19 DIAGNOSIS — I77819 Aortic ectasia, unspecified site: Secondary | ICD-10-CM

## 2024-07-19 DIAGNOSIS — I719 Aortic aneurysm of unspecified site, without rupture: Secondary | ICD-10-CM | POA: Diagnosis not present

## 2024-07-19 DIAGNOSIS — K8020 Calculus of gallbladder without cholecystitis without obstruction: Secondary | ICD-10-CM | POA: Diagnosis not present

## 2024-07-19 DIAGNOSIS — R918 Other nonspecific abnormal finding of lung field: Secondary | ICD-10-CM | POA: Insufficient documentation

## 2024-07-19 MED ORDER — IOHEXOL 350 MG/ML SOLN
75.0000 mL | Freq: Once | INTRAVENOUS | Status: AC | PRN
  Administered 2024-07-19: 75 mL via INTRAVENOUS
